# Patient Record
Sex: Female | Born: 1990 | Race: White | Hispanic: No | Marital: Single | Smoking: Current every day smoker
Health system: Southern US, Community
[De-identification: ages and names within clinical notes are randomized; demographics above are authoritative.]

## PROBLEM LIST (undated history)

## (undated) DIAGNOSIS — R7303 Prediabetes: Secondary | ICD-10-CM

## (undated) DIAGNOSIS — T7840XA Allergy, unspecified, initial encounter: Secondary | ICD-10-CM

## (undated) DIAGNOSIS — I10 Essential (primary) hypertension: Secondary | ICD-10-CM

## (undated) DIAGNOSIS — F445 Conversion disorder with seizures or convulsions: Secondary | ICD-10-CM

## (undated) HISTORY — DX: Essential (primary) hypertension: I10

## (undated) HISTORY — DX: Morbid (severe) obesity due to excess calories: E66.01

## (undated) HISTORY — DX: Prediabetes: R73.03

## (undated) HISTORY — DX: Conversion disorder with seizures or convulsions: F44.5

## (undated) HISTORY — DX: Allergy, unspecified, initial encounter: T78.40XA

---

## 2011-07-01 ENCOUNTER — Emergency Department (HOSPITAL_BASED_OUTPATIENT_CLINIC_OR_DEPARTMENT_OTHER)
Admission: EM | Admit: 2011-07-01 | Discharge: 2011-07-02 | Disposition: A | Discharge status: Home / Self Care | Payer: Managed Care, Other (non HMO) | Attending: Emergency Medicine | Admitting: Emergency Medicine

## 2011-07-01 ENCOUNTER — Encounter (HOSPITAL_BASED_OUTPATIENT_CLINIC_OR_DEPARTMENT_OTHER): Payer: Self-pay | Admitting: *Deleted

## 2011-07-01 ENCOUNTER — Emergency Department (INDEPENDENT_AMBULATORY_CARE_PROVIDER_SITE_OTHER): Payer: Managed Care, Other (non HMO)

## 2011-07-01 DIAGNOSIS — M79609 Pain in unspecified limb: Secondary | ICD-10-CM | POA: Insufficient documentation

## 2011-07-01 DIAGNOSIS — R109 Unspecified abdominal pain: Secondary | ICD-10-CM

## 2011-07-01 DIAGNOSIS — M25519 Pain in unspecified shoulder: Secondary | ICD-10-CM | POA: Insufficient documentation

## 2011-07-01 LAB — URINALYSIS, ROUTINE W REFLEX MICROSCOPIC
Ketones, ur: NEGATIVE mg/dL
Leukocytes, UA: NEGATIVE
Nitrite: NEGATIVE
Specific Gravity, Urine: 1.031 — ABNORMAL HIGH (ref 1.005–1.030)
Urobilinogen, UA: 0.2 mg/dL (ref 0.0–1.0)
pH: 5.5 (ref 5.0–8.0)

## 2011-07-01 NOTE — ED Notes (Signed)
C/o right arm pain- denies known injury- took ibuprofen at 2130

## 2011-07-02 DIAGNOSIS — M79609 Pain in unspecified limb: Secondary | ICD-10-CM

## 2011-07-02 NOTE — ED Provider Notes (Signed)
History     CSN: [id number]  Arrival date & time 07/01/11  2248   First MD Initiated Contact with Patient 07/01/11 2308      Chief Complaint  Patient presents with  . Arm Pain    (Consider location/radiation/quality/duration/timing/severity/associated sxs/prior treatment) HPI Patient presents with complaint of pain in her right anterior shoulder which began while she was working at [employer] earlier [employer]. She states the pain was worse with movement. She did do some heavy lifting of boxes today while at work. She also complains of a sharp pain in her right flank. She denies any dysuria, urinary frequency or urgency and no hematuria. She's had no fevers or chills and no vomiting. She denies any specific injury or trauma to her shoulder or torso. She states that she took ibuprofen for her discomfort and now her pain has completely resolved. She states that she has had milder episodes of right shoulder pain in the past but tonight was worse than usual. Movement and palpation made the shoulder pain worse. There no other associated systemic symptoms.  She specifically denies chest pain or shortness of breath  History reviewed. No pertinent past medical history.  History reviewed. No pertinent past surgical history.  No family history on file.  History  Substance Use Topics  . Smoking status: Never Smoker   . Smokeless tobacco: Never Used  . Alcohol Use: No    OB History    Grav Para Term Preterm Abortions TAB SAB Ect Mult Living                  Review of Systems ROS reviewed and otherwise negative except for mentioned in HPI  Allergies  Review of patient's allergies indicates no known allergies.  Home Medications   Current Outpatient Rx  Name Route Sig Dispense Refill  . IBUPROFEN 200 MG PO TABS Oral Take 400 mg by mouth every 6 (six) hours as needed. For pain    . ADULT MULTIVITAMIN W/MINERALS CH Oral Take 1 tablet by mouth daily.    Marland Kitchen OVER THE COUNTER MEDICATION Oral Take  1 tablet by mouth 2 (two) times daily. Raspberry Ketone capsules    . SERTRALINE HCL 100 MG PO TABS Oral Take 100 mg by mouth daily.      BP 149/92  Pulse 81  Temp(Src) 98.9 F (37.2 C) (Oral)  Resp 20  Ht 5\' 11"  (1.803 m)  Wt 300 lb (136.079 kg)  BMI 41.84 kg/m2  SpO2 100%  LMP 06/28/2011 Vitals reviewed Physical Exam Physical Examination: General appearance - alert, well appearing, and in no distress Eyes - pupils equal and reactive, no conjunctival injection Mouth - mucous membranes moist, pharynx normal without lesions Chest - clear to auscultation, no wheezes, rales or rhonchi, symmetric air entry Heart - normal rate, regular rhythm, normal S1, S2, no murmurs, rubs, clicks or gallops Abdomen - soft, nontender, nondistended, no masses or organomegaly Back exam - full range of motion, no tenderness, no CVA tenderness Musculoskeletal - mild right anterior ttp over shoulder, no deformity or swelling, mild pain with ROM of right shoulder Extremities - peripheral pulses normal, no pedal edema, no clubbing or cyanosis Skin - normal coloration and turgor, no rashes  ED Course  Procedures (including critical care time)  Labs Reviewed  URINALYSIS, ROUTINE W REFLEX MICROSCOPIC - Abnormal; Notable for the following:    Specific Gravity, Urine 1.031 (*)    All other components within normal limits  PREGNANCY, URINE   Dg Shoulder Right  07/02/2011  *RADIOLOGY REPORT*  Clinical Data: Pain without trauma.  RIGHT SHOULDER - 2+ VIEW  Comparison: None.  Findings: Negative for fracture, dislocation, or other acute abnormality. Normal alignment and mineralization. No significant degenerative change.  Regional soft tissues unremarkable.  IMPRESSION:  Negative  Original Report Authenticated By: Thora Lance III, M.D.     1. Shoulder pain   2. Flank pain       MDM  Patient presenting with right-sided shoulder pain as well as right flank pain. Shoulder x-ray was reassuring and  shoulder discomfort seems to be musculoskeletal in nature. Patient currently has no flank pain and has normal examination. Her urinalysis was negative for signs of infection or hematuria. Advised patient to take ibuprofen as needed for her discomfort and if pain recurs she should followup with her primary Dr. She was given strict return precautions and is agreeable with this plan.        Ethelda Chick, MD 07/02/11 [phone]

## 2012-09-11 ENCOUNTER — Other Ambulatory Visit: Payer: Self-pay | Admitting: Internal Medicine

## 2012-09-17 ENCOUNTER — Ambulatory Visit
Admission: RE | Admit: 2012-09-17 | Discharge: 2012-09-17 | Disposition: A | Discharge status: Home / Self Care | Payer: Managed Care, Other (non HMO) | Source: Ambulatory Visit | Attending: Internal Medicine | Admitting: Internal Medicine

## 2012-09-17 MED ORDER — IOHEXOL 300 MG/ML  SOLN
125.0000 mL | Freq: Once | INTRAMUSCULAR | Status: AC | PRN
  Administered 2012-09-17: 125 mL via INTRAVENOUS

## 2013-04-07 ENCOUNTER — Encounter: Payer: Self-pay | Admitting: Internal Medicine

## 2013-04-09 ENCOUNTER — Ambulatory Visit: Payer: Self-pay | Admitting: Physician Assistant

## 2013-04-09 ENCOUNTER — Encounter: Payer: Self-pay | Admitting: Physician Assistant

## 2013-04-09 VITALS — BP 118/70 | HR 88 | Temp 97.0°F | Resp 16 | Ht 71.0 in | Wt 345.0 lb

## 2013-04-09 DIAGNOSIS — R7303 Prediabetes: Secondary | ICD-10-CM

## 2013-04-09 DIAGNOSIS — T7840XD Allergy, unspecified, subsequent encounter: Secondary | ICD-10-CM

## 2013-04-09 DIAGNOSIS — I10 Essential (primary) hypertension: Secondary | ICD-10-CM

## 2013-04-09 MED ORDER — PHENTERMINE HCL 37.5 MG PO CAPS: 37.5000 mg | ORAL_CAPSULE | Freq: Every day | ORAL | Status: DC

## 2013-04-09 NOTE — Progress Notes (Signed)
HPI Patient presents for a one month follow up morbid obesity with comorbid conditions of HTN, prediabetes. She started on phenteramine 37.5 one pill 2 weeks ago and today she has lost 9 lbs and denies palpitations, irregular heart beats, stomach pain, muscle weakness, anxiety, insomnia.   Allergies:  No Known Allergies  Current Medications:     Medication List       This list is accurate as of: 04/09/13 11:05 AM.  Always use your most recent med list.               cholecalciferol 1000 UNITS tablet  Commonly known as:  VITAMIN D  Take 9,000 Units by mouth daily.     ibuprofen 200 MG tablet  Commonly known as:  ADVIL,MOTRIN  Take 400 mg by mouth every 6 (six) hours as needed. For pain     lisinopril 20 MG tablet  Commonly known as:  PRINIVIL,ZESTRIL  Take 20 mg by mouth daily.     Magnesium 500 MG Caps  Take by mouth.     metFORMIN 500 MG 24 hr tablet  Commonly known as:  GLUCOPHAGE-XR  Take 500 mg by mouth 2 (two) times daily with a meal.     phentermine 37.5 MG capsule  Take 37.5 mg by mouth daily.     sertraline 100 MG tablet  Commonly known as:  ZOLOFT  Take 100 mg by mouth daily. Take 1.5 pills daily        ROS: all negative expect above.   Physical: Filed Weights   04/09/13 1054  Weight: 345 lb (156.491 kg)   (354 on Oct 16,2014) Filed Vitals:   04/09/13 1054  BP: 118/70  Pulse: 88  Temp: 97 F (36.1 C)  Resp: 16   General Appearance: Well nourished, in no apparent distress. Eyes: PERRLA, EOMs. Sinuses: No Frontal/maxillary tenderness ENT/Mouth: Ext aud canals clear, normal light reflex with TMs without erythema, bulging. Post pharynx without erythema, swelling, exudate.  Respiratory: CTAB Cardio: RRR, no murmurs, rubs or gallops. Peripheral pulses brisk and equal bilaterally, without edema. No aortic or femoral bruits. Abdomen: Obese, soft, with bowl sounds. Nontender, no guarding, rebound. Lymphatics: Non tender without lymphadenopathy.   Musculoskeletal: Full ROM all peripheral extremities, 5/5 strength, and normal gait. Skin: Warm, dry without rashes, lesions, ecchymosis.  Neuro: Cranial nerves intact, reflexes equal bilaterally. Normal muscle tone, no cerebellar symptoms. Sensation intact.  Pysch: Awake and oriented X 3, normal affect, Insight and Judgment appropriate.   Assessment and Plan:  Morbid obesity with HTN, PreDM- Patient is not having any side effects and has lost 9 lbs in 2 weeks.  She has increased her water, starting to eat breakfast, and not doing sodas/sweet tea.  We will continue the phenteramine 37.5mg  with 2 refills and see her back in 2 months.

## 2013-04-09 NOTE — Patient Instructions (Signed)
Calorie Counting Diet A calorie counting diet requires you to eat the number of calories that are right for you in a day. Calories are the measurement of how much energy you get from the food you eat. Eating the right amount of calories is important for staying at a healthy weight. If you eat too many calories, your body will store them as fat and you may gain weight. If you eat too few calories, you may lose weight. Counting the number of calories you eat during a day will help you know if you are eating the right amount. A Registered Dietitian can determine how many calories you need in a day. The amount of calories needed varies from person to person. If your goal is to lose weight, you will need to eat fewer calories. Losing weight can benefit you if you are overweight or have health problems such as heart disease, high blood pressure, or diabetes. If your goal is to gain weight, you will need to eat more calories. Gaining weight may be necessary if you have a certain health problem that causes your body to need more energy. TIPS Whether you are increasing or decreasing the number of calories you eat during a day, it may be hard to get used to changes in what you eat and drink. The following are tips to help you keep track of the number of calories you eat.  Measure foods at home with measuring cups. This helps you know the amount of food and number of calories you are eating.  Restaurants often serve food in amounts that are larger than 1 serving. While eating out, estimate how many servings of a food you are given. For example, a serving of cooked rice is  cup or about the size of half of a fist. Knowing serving sizes will help you be aware of how much food you are eating at restaurants.  Ask for smaller portion sizes or child-size portions at restaurants.  Plan to eat half of a meal at a restaurant. Take the rest home or share the other half with a friend.  Read the Nutrition Facts panel on  food labels for calorie content and serving size. You can find out how many servings are in a package, the size of a serving, and the number of calories each serving has.  For example, a package might contain 3 cookies. The Nutrition Facts panel on that package says that 1 serving is 1 cookie. Below that, it will say there are 3 servings in the container. The calories section of the Nutrition Facts label says there are 90 calories. This means there are 90 calories in 1 cookie (1 serving). If you eat 1 cookie you have eaten 90 calories. If you eat all 3 cookies, you have eaten 270 calories (3 servings x 90 calories = 270 calories). The list below tells you how big or small some common portion sizes are.  1 oz.........4 stacked dice.  3 oz.........Deck of cards.  1 tsp........Tip of little finger.  1 tbs........Thumb.  2 tbs........Golf ball.   cup.......Half of a fist.  1 cup........A fist. KEEP A FOOD LOG Write down every food item you eat, the amount you eat, and the number of calories in each food you eat during the day. At the end of the day, you can add up the total number of calories you have eaten. It may help to keep a list like the one below. Find out the calorie information by reading the   Nutrition Facts panel on food labels. Breakfast  Bran cereal (1 cup, 110 calories).  Fat-free milk ( cup, 45 calories). Snack  Apple (1 medium, 80 calories). Lunch  Spinach (1 cup, 20 calories).  Tomato ( medium, 20 calories).  Chicken breast strips (3 oz, 165 calories).  Shredded cheddar cheese ( cup, 110 calories).  Light Italian dressing (2 tbs, 60 calories).  Whole-wheat bread (1 slice, 80 calories).  Tub margarine (1 tsp, 35 calories).  Vegetable soup (1 cup, 160 calories). Dinner  Pork chop (3 oz, 190 calories).  Brown rice (1 cup, 215 calories).  Steamed broccoli ( cup, 20 calories).  Strawberries (1  cup, 65 calories).  Whipped cream (1 tbs, 50  calories). Daily Calorie Total: 1425 Document Released: 05/14/2005 Document Revised: 08/06/2011 Document Reviewed: 11/08/2006 [employer] Patient Information 2014 [employer].  

## 2013-06-30 ENCOUNTER — Encounter: Payer: Self-pay | Admitting: Emergency Medicine

## 2013-06-30 ENCOUNTER — Ambulatory Visit (INDEPENDENT_AMBULATORY_CARE_PROVIDER_SITE_OTHER): Payer: BC Managed Care – PPO | Admitting: Emergency Medicine

## 2013-06-30 VITALS — BP 124/96 | HR 88 | Temp 98.2°F | Resp 18 | Ht 71.0 in | Wt 336.0 lb

## 2013-06-30 DIAGNOSIS — R7309 Other abnormal glucose: Secondary | ICD-10-CM

## 2013-06-30 DIAGNOSIS — Z79899 Other long term (current) drug therapy: Secondary | ICD-10-CM

## 2013-06-30 DIAGNOSIS — R5383 Other fatigue: Secondary | ICD-10-CM

## 2013-06-30 DIAGNOSIS — E559 Vitamin D deficiency, unspecified: Secondary | ICD-10-CM

## 2013-06-30 DIAGNOSIS — I10 Essential (primary) hypertension: Secondary | ICD-10-CM

## 2013-06-30 DIAGNOSIS — E669 Obesity, unspecified: Secondary | ICD-10-CM

## 2013-06-30 DIAGNOSIS — R5381 Other malaise: Secondary | ICD-10-CM

## 2013-06-30 DIAGNOSIS — J029 Acute pharyngitis, unspecified: Secondary | ICD-10-CM

## 2013-06-30 LAB — HEPATIC FUNCTION PANEL
ALK PHOS: 91 U/L (ref 39–117)
ALT: 15 U/L (ref 0–35)
AST: 16 U/L (ref 0–37)
Albumin: 4.2 g/dL (ref 3.5–5.2)
BILIRUBIN INDIRECT: 0.4 mg/dL (ref 0.2–1.2)
BILIRUBIN TOTAL: 0.6 mg/dL (ref 0.2–1.2)
Bilirubin, Direct: 0.2 mg/dL (ref 0.0–0.3)
TOTAL PROTEIN: 7.5 g/dL (ref 6.0–8.3)

## 2013-06-30 LAB — BASIC METABOLIC PANEL WITH GFR
BUN: 12 mg/dL (ref 6–23)
CHLORIDE: 106 meq/L (ref 96–112)
CO2: 24 meq/L (ref 19–32)
CREATININE: 0.66 mg/dL (ref 0.50–1.10)
Calcium: 9.5 mg/dL (ref 8.4–10.5)
GFR, Est African American: >89 mL/min
GFR, Est Non African American: >89 mL/min
Glucose, Bld: 79 mg/dL (ref 70–99)
Potassium: 4.6 mEq/L (ref 3.5–5.3)
Sodium: 138 mEq/L (ref 135–145)

## 2013-06-30 LAB — VITAMIN B12: VITAMIN B 12: 574 pg/mL (ref 211–911)

## 2013-06-30 LAB — MAGNESIUM: Magnesium: 1.7 mg/dL (ref 1.5–2.5)

## 2013-06-30 LAB — HEMOGLOBIN A1C
Hgb A1c MFr Bld: 5.5 % (ref <5.7)
Mean Plasma Glucose: 111 mg/dL (ref <117)

## 2013-06-30 MED ORDER — AZITHROMYCIN 250 MG PO TABS: ORAL_TABLET | ORAL | Status: AC

## 2013-06-30 MED ORDER — PHENTERMINE HCL 37.5 MG PO CAPS: 37.5000 mg | ORAL_CAPSULE | Freq: Every day | ORAL | Status: DC

## 2013-06-30 MED ORDER — PREDNISONE 10 MG PO TABS: ORAL_TABLET | ORAL | Status: DC

## 2013-06-30 NOTE — Patient Instructions (Addendum)
Exercise to Lose Weight Exercise and a healthy diet may help you lose weight. Your doctor may suggest specific exercises. EXERCISE IDEAS AND TIPS  Choose low-cost things you enjoy doing, such as walking, bicycling, or exercising to workout videos.  Take stairs instead of the elevator.  Walk during your lunch break.  Park your car further away from work or school.  Go to a gym or an exercise class.  Start with 5 to 10 minutes of exercise each day. Build up to 30 minutes of exercise 4 to 6 days a week.  Wear shoes with good support and comfortable clothes.  Stretch before and after working out.  Work out until you breathe harder and your heart beats faster.  Drink extra water when you exercise.  Do not do so much that you hurt yourself, feel dizzy, or get very short of breath. Exercises that burn about 150 calories:  Running 1  miles in 15 minutes.  Playing volleyball for 45 to 60 minutes.  Washing and waxing a car for 45 to 60 minutes.  Playing touch football for 45 minutes.  Walking 1  miles in 35 minutes.  Pushing a stroller 1  miles in 30 minutes.  Playing basketball for 30 minutes.  Raking leaves for 30 minutes.  Bicycling 5 miles in 30 minutes.  Walking 2 miles in 30 minutes.  Dancing for 30 minutes.  Shoveling snow for 15 minutes.  Swimming laps for 20 minutes.  Walking up stairs for 15 minutes.  Bicycling 4 miles in 15 minutes.  Gardening for 30 to 45 minutes.  Jumping rope for 15 minutes.  Washing windows or floors for 45 to 60 minutes. Document Released: 06/16/2010 Document Revised: 08/06/2011 Document Reviewed: 06/16/2010 [facility] Patient Information 2014 [place], [place]. Sore Throat Warm salt water gargles daily. 1 tsp liquid benadryl + 1 tsp liquid Maalox, MIX/ GARGLE/ SPIT as needed for pain Add allegra in the daytime for allergy. Tylenol OTC for pain. A sore throat is a painful, burning, sore, or scratchy feeling of the throat. There  may be pain or tenderness when swallowing or talking. You may have other symptoms with a sore throat. These include coughing, sneezing, fever, or a swollen neck. A sore throat is often the first sign of another sickness. These sicknesses may include a cold, flu, strep throat, or an infection called mono. Most sore throats go away without medical treatment.  HOME CARE   Only take medicine as told by your doctor.  Drink enough fluids to keep your pee (urine) clear or pale yellow.  Rest as needed.  Try using throat sprays, lozenges, or suck on hard candy (if older than 4 years or as told).  Sip warm liquids, such as broth, herbal tea, or warm water with honey. Try sucking on frozen ice pops or drinking cold liquids.  Rinse the mouth (gargle) with salt water. Mix 1 teaspoon salt with 8 ounces of water.  Do not smoke. Avoid being around others when they are smoking.  Put a humidifier in your bedroom at night to moisten the air. You can also turn on a hot shower and sit in the bathroom for 5 10 minutes. Be sure the bathroom door is closed. GET HELP RIGHT AWAY IF:   You have trouble breathing.  You cannot swallow fluids, soft foods, or your spit (saliva).  You have more puffiness (swelling) in the throat.  Your sore throat does not get better in 7 days.  You feel sick to your stomach (  nauseous) and throw up (vomit).  You have a fever or lasting symptoms for more than 2 3 days.  You have a fever and your symptoms suddenly get worse. MAKE SURE YOU:   Understand these instructions.  Will watch your condition.  Will get help right away if you are not doing well or get worse. Document Released: 02/21/2008 Document Revised: 02/06/2012 Document Reviewed: 01/20/2012 [facility] Patient Information 2014 [place], [place].

## 2013-06-30 NOTE — Progress Notes (Signed)
Subjective:    Patient ID: Christy Foster, female    DOB: 1990-08-10, 23 y.o.   MRN: [id number]  HPI Comments: 23 yo female with allergy drainage x >1 week with out any relief with benadryl/ Dayquil. She notes her throat has now started hurting and drainage continues to come out but clear.  She started Phentermine 03/2013 she is down 23 # total, down 9 # since last visit. She is eating healthier/ smaller portions. She is not exercising.  LAST LABS 5.9 D 54  Sore Throat  Associated symptoms include congestion.     Medication List       This list is accurate as of: 06/30/13 10:09 AM.  Always use your most recent med list.               cholecalciferol 1000 UNITS tablet  Commonly known as:  VITAMIN D  Take 9,000 Units by mouth daily.     ibuprofen 200 MG tablet  Commonly known as:  ADVIL,MOTRIN  Take 400 mg by mouth every 6 (six) hours as needed. For pain     lisinopril 20 MG tablet  Commonly known as:  PRINIVIL,ZESTRIL  Take 20 mg by mouth daily.     Magnesium 500 MG Caps  Take by mouth.     metFORMIN 500 MG 24 hr tablet  Commonly known as:  GLUCOPHAGE-XR  Take 500 mg by mouth 2 (two) times daily with a meal.     phentermine 37.5 MG capsule  Take 1 capsule (37.5 mg total) by mouth daily.     sertraline 100 MG tablet  Commonly known as:  ZOLOFT  Take 100 mg by mouth daily. Take 1.5 pills daily       ALLERGIES Review of patient's allergies indicates no known allergies.  Past Medical History  Diagnosis Date  . Hypertension   . Allergy   . Pseudoseizure   . Prediabetes   . Morbid obesity       Review of Systems  Constitutional: Positive for fatigue.  HENT: Positive for congestion and sore throat.   All other systems reviewed and are negative.   BP 124/96  Pulse 88  Temp(Src) 98.2 F (36.8 C) (Temporal)  Resp 18  Ht 5\' 11"  (1.803 m)  Wt 336 lb (152.409 kg)  BMI 46.88 kg/m2  LMP 06/08/2013     Objective:   Physical Exam  Nursing note and  vitals reviewed. Constitutional: She is oriented to person, place, and time. She appears well-developed and well-nourished. No distress.  obese  HENT:  Head: Normocephalic and atraumatic.  Right Ear: External ear normal.  Left Ear: External ear normal.  Nose: Nose normal.  Mouth/Throat: Oropharynx is clear and moist. No oropharyngeal exudate.  2 + erythematous tonsils with yellow exudate  Eyes: Conjunctivae and EOM are normal.  Neck: Normal range of motion. Neck supple. No JVD present. No thyromegaly present.  Cardiovascular: Normal rate, regular rhythm, normal heart sounds and intact distal pulses.   Pulmonary/Chest: Effort normal and breath sounds normal.  Abdominal: Soft. Bowel sounds are normal. She exhibits no distension and no mass. There is no tenderness. There is no rebound and no guarding.  Musculoskeletal: Normal range of motion. She exhibits no edema and no tenderness.  Lymphadenopathy:    She has cervical adenopathy.  Neurological: She is alert and oriented to person, place, and time. No cranial nerve deficit.  Skin: Skin is warm and dry. No rash noted. No erythema. No pallor.  Psychiatric: She has a  normal mood and affect. Her behavior is normal. Judgment and thought content normal.      EKG WNL    Assessment & Plan:  1.  3 month F/U for HTN, Obese, Pre-Dm, D. Deficient. Needs healthy diet, cardio QD and obtain healthy weight. Check Labs, Check BP if >130/80 call office. Refill Phentermine 37.5 try to start decreasing to 1/2 tab, use AD, Check EKG with Wt loss Rx 2. Fatigue- check labs, increase activity and H2O 3. Prob strept- Pred Dp 10mg  AD, Zpak AD. Allergic rhinitis- Allegra OTC, increase H2o, allergy hygiene explained. OOw x 2 days Warm salt water gargles daily. 1 tsp liquid benadryl + 1 tsp liquid Maalox, MIX/ GARGLE/ SPIT as needed for pain

## 2013-07-01 LAB — CBC WITH DIFFERENTIAL/PLATELET
Basophils Absolute: 0.1 10*3/uL (ref 0.0–0.1)
Basophils Relative: 0 % (ref 0–1)
EOS PCT: 1 % (ref 0–5)
Eosinophils Absolute: 0.2 10*3/uL (ref 0.0–0.7)
HEMATOCRIT: 39.5 % (ref 36.0–46.0)
HEMOGLOBIN: 13.4 g/dL (ref 12.0–15.0)
LYMPHS ABS: 3.2 10*3/uL (ref 0.7–4.0)
LYMPHS PCT: 19 % (ref 12–46)
MCH: 29.4 pg (ref 26.0–34.0)
MCHC: 33.9 g/dL (ref 30.0–36.0)
MCV: 86.6 fL (ref 78.0–100.0)
MONO ABS: 1.5 10*3/uL — AB (ref 0.1–1.0)
Monocytes Relative: 9 % (ref 3–12)
Neutro Abs: 11.6 10*3/uL — ABNORMAL HIGH (ref 1.7–7.7)
Neutrophils Relative %: 71 % (ref 43–77)
Platelets: 334 10*3/uL (ref 150–400)
RBC: 4.56 MIL/uL (ref 3.87–5.11)
RDW: 13.2 % (ref 11.5–15.5)
WBC: 16.6 10*3/uL — AB (ref 4.0–10.5)

## 2013-07-01 LAB — VITAMIN D 25 HYDROXY (VIT D DEFICIENCY, FRACTURES): Vit D, 25-Hydroxy: 48 ng/mL (ref 30–89)

## 2013-07-01 LAB — INSULIN, FASTING: Insulin fasting, serum: 33 u[IU]/mL — ABNORMAL HIGH (ref 3–28)

## 2013-07-07 NOTE — Progress Notes (Signed)
LMOM TO CALL & SCHEDULE  

## 2013-08-12 NOTE — Progress Notes (Signed)
#  2   = LMOM TO CALL  

## 2013-08-19 NOTE — Progress Notes (Signed)
#  2 LMOM =  TO CALL & SCHEDULE

## 2013-08-23 IMAGING — CT CT ABDOMEN W/ CM
2 of 4 series · 17 of 46 positions shown, 19 images · IV contrast (READICAT/WATER & [ID] OMNI 300)
Comparison: None.

CLINICAL DATA: Evaluate palpable mass.

CT ABDOMEN WITH CONTRAST
TECHNIQUE: Multidetector CT imaging of the abdomen was performed
following the standard protocol during bolus administration of
intravenous contrast.
Contrast: 125mL OMNIPAQUE IOHEXOL 300 MG/ML  SOLN

[Series 2: abdomen w/ · axial · 0.98mm/px · z∈[-288,+26]mm · 14 of 71 slices shown, 16 images]
[im 4/71  soft-tissue]
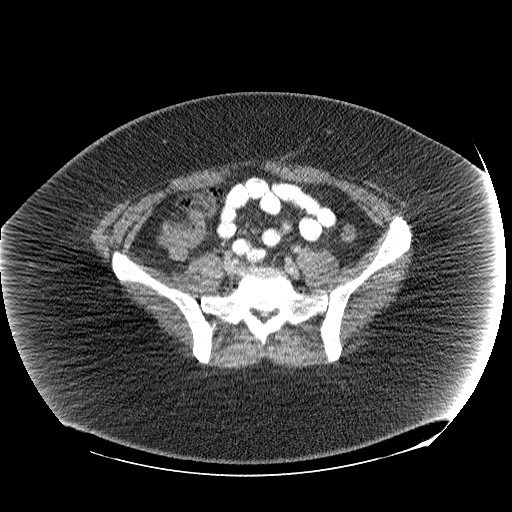
[im 4/71  bone]
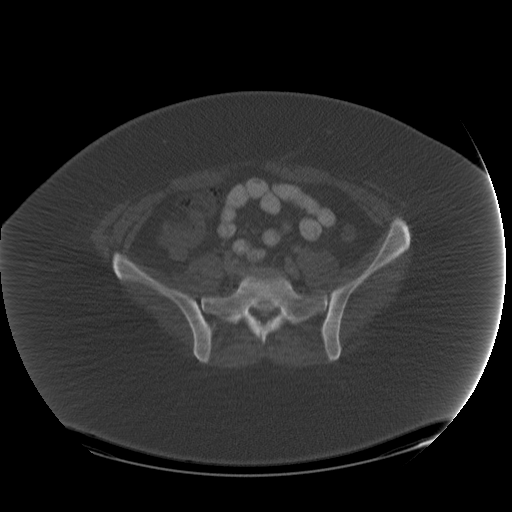
[im 10/71  soft-tissue]
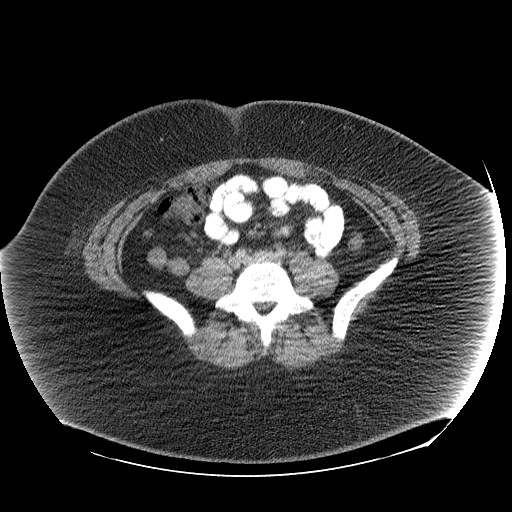
[im 13/71  soft-tissue]
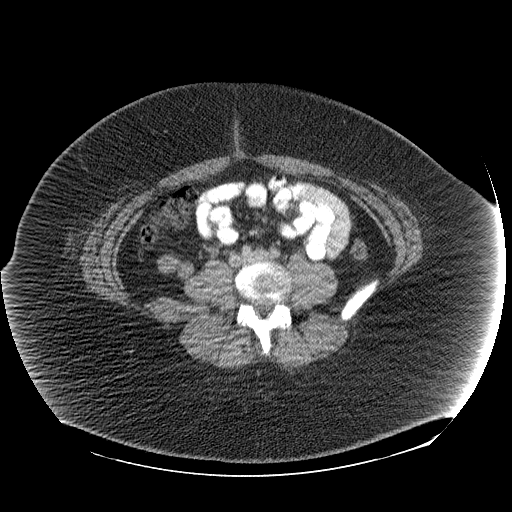
[im 20/71  soft-tissue]
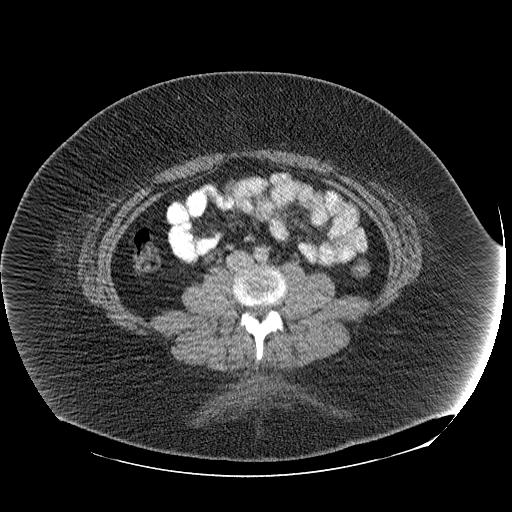
[im 23/71  soft-tissue]
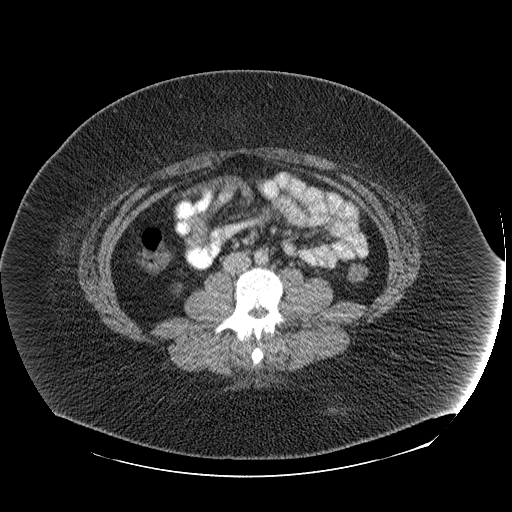
[im 29/71  soft-tissue]
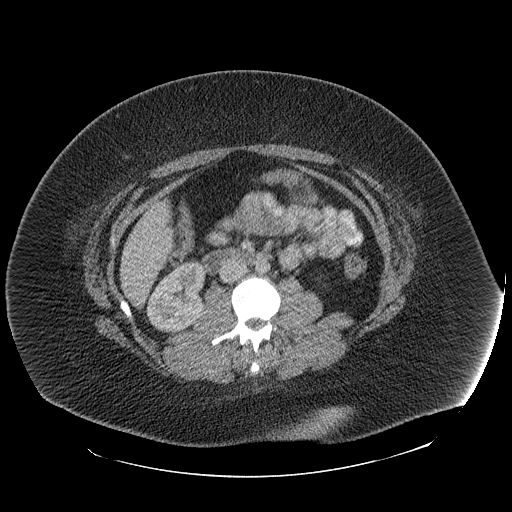
[im 32/71  soft-tissue]
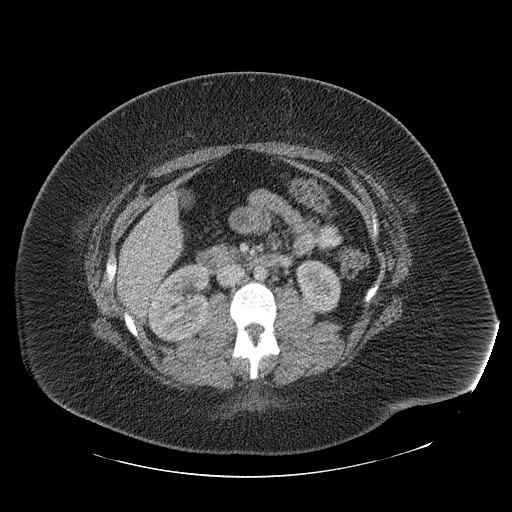
[im 39/71  soft-tissue]
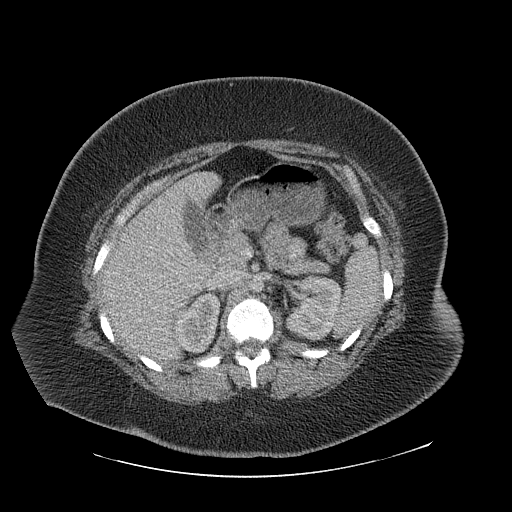
[im 42/71  soft-tissue]
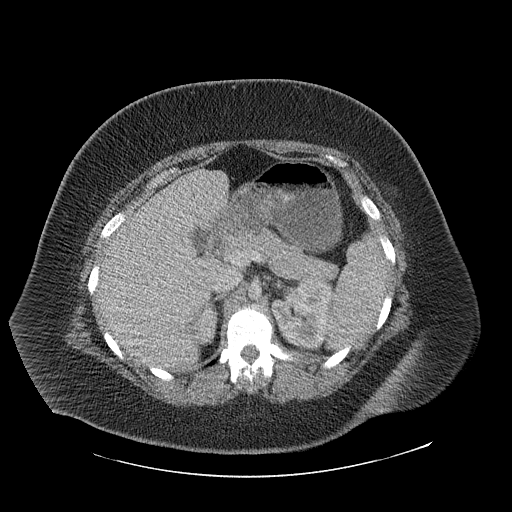
[im 42/71  bone]
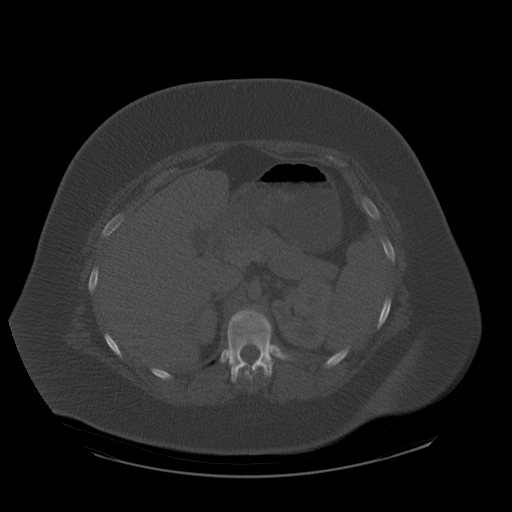
[im 48/71  soft-tissue]
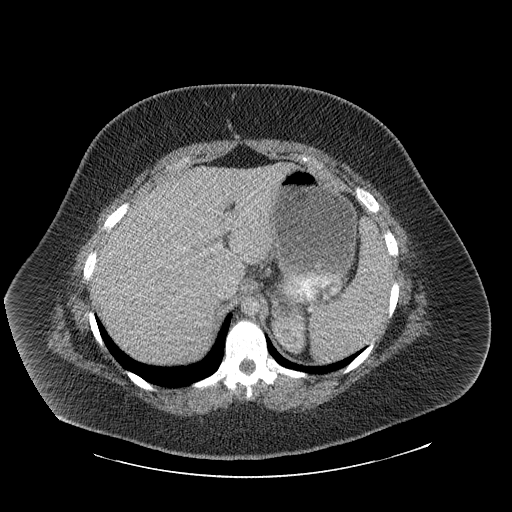
[im 51/71  soft-tissue]
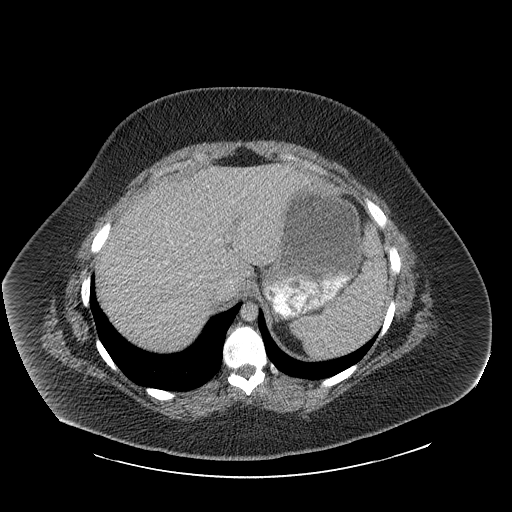
[im 58/71  soft-tissue]
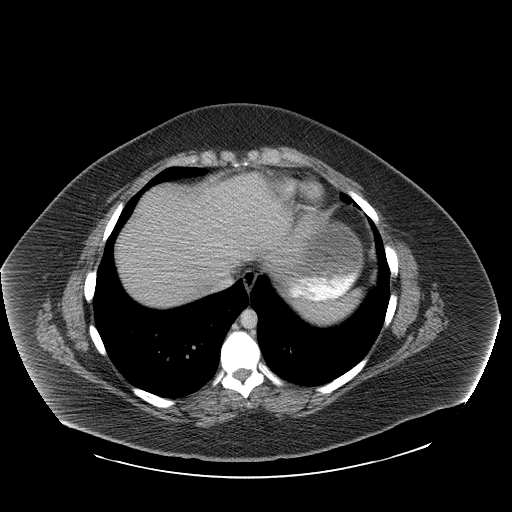
[im 61/71  soft-tissue]
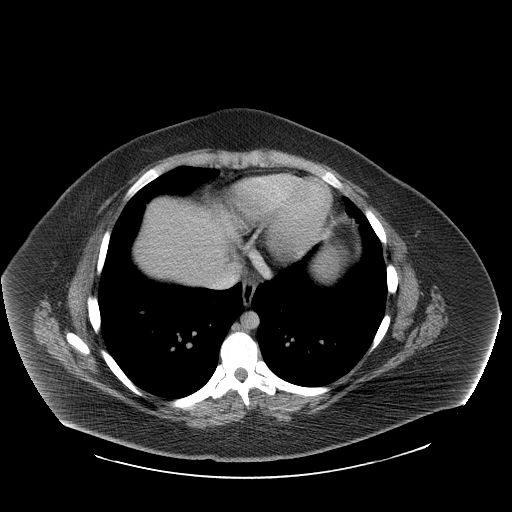
[im 67/71  soft-tissue]
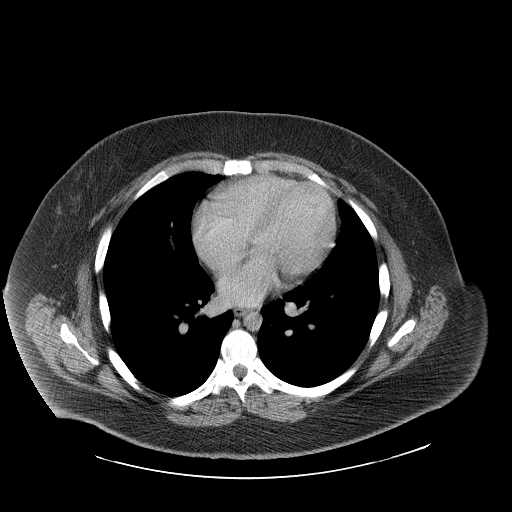

[Series 400: coronal · coronal · 0.98mm/px · 3 of 166 slices shown]
[im 56/166  soft-tissue]
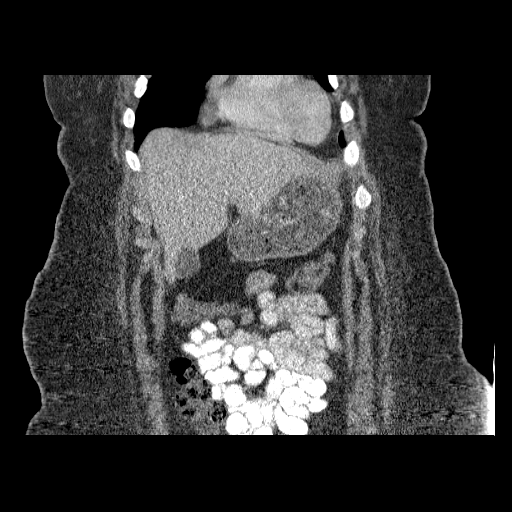
[im 74/166  soft-tissue]
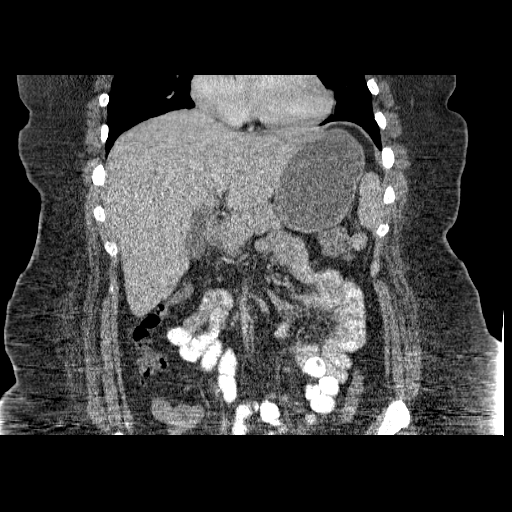
[im 92/166  soft-tissue]
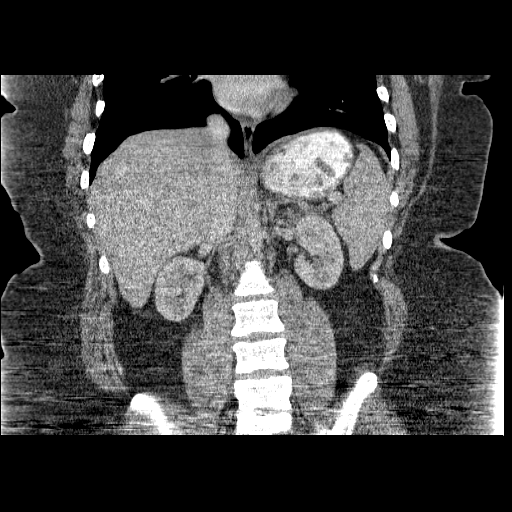

[17 of 46 positions shown; findings below may reference images not displayed]

FINDINGS: Lung bases show no acute findings.  Heart size within
normal limits.  No pericardial or pleural effusion.

Image quality is somewhat degraded by body habitus.  Liver,
gallbladder, adrenal glands, kidneys, spleen, pancreas, stomach and
visualized bowel are unremarkable.  No pathologically enlarged
lymph nodes.  No free fluid.

A cutaneous marker was placed over the ventral and medial aspect of
the right upper quadrant (image 28).  There is no associated soft
tissue abnormality.  No worrisome lytic or sclerotic lesions.
IMPRESSION: No findings to explain the palpable abnormality along the ventral
abdomen, reported by the patient.

## 2013-08-24 NOTE — Progress Notes (Signed)
No response from pt called several times to schedule

## 2013-09-04 ENCOUNTER — Ambulatory Visit (INDEPENDENT_AMBULATORY_CARE_PROVIDER_SITE_OTHER): Payer: BC Managed Care – PPO | Admitting: *Deleted

## 2013-09-04 MED ORDER — PHENTERMINE HCL 37.5 MG PO CAPS: 37.5000 mg | ORAL_CAPSULE | ORAL | Status: DC

## 2013-09-04 NOTE — Progress Notes (Signed)
Patient ID: [name], female   DOB: 29-Sep-1990, 23 y.o.   MRN: [id number] Patient here for weight and BP check for refill  Of Phentermine 37.5 mg.  Patient's weight down 7.8 lb. RX refilled by Dr Oneta RackMcKeown.

## 2013-10-01 ENCOUNTER — Other Ambulatory Visit: Payer: Self-pay | Admitting: Emergency Medicine

## 2013-10-05 ENCOUNTER — Ambulatory Visit (INDEPENDENT_AMBULATORY_CARE_PROVIDER_SITE_OTHER): Payer: BC Managed Care – PPO | Admitting: *Deleted

## 2013-10-05 VITALS — BP 136/82

## 2013-10-05 DIAGNOSIS — R6889 Other general symptoms and signs: Secondary | ICD-10-CM

## 2013-10-05 LAB — CBC WITH DIFFERENTIAL/PLATELET
BASOS ABS: 0 10*3/uL (ref 0.0–0.1)
Basophils Relative: 0 % (ref 0–1)
Eosinophils Absolute: 0.3 10*3/uL (ref 0.0–0.7)
Eosinophils Relative: 2 % (ref 0–5)
HEMATOCRIT: 37.2 % (ref 36.0–46.0)
HEMOGLOBIN: 12.6 g/dL (ref 12.0–15.0)
LYMPHS PCT: 28 % (ref 12–46)
Lymphs Abs: 3.9 10*3/uL (ref 0.7–4.0)
MCH: 28.8 pg (ref 26.0–34.0)
MCHC: 33.9 g/dL (ref 30.0–36.0)
MCV: 85.1 fL (ref 78.0–100.0)
MONO ABS: 1 10*3/uL (ref 0.1–1.0)
Monocytes Relative: 7 % (ref 3–12)
NEUTROS ABS: 8.8 10*3/uL — AB (ref 1.7–7.7)
Neutrophils Relative %: 63 % (ref 43–77)
Platelets: 359 10*3/uL (ref 150–400)
RBC: 4.37 MIL/uL (ref 3.87–5.11)
RDW: 13.2 % (ref 11.5–15.5)
WBC: 14 10*3/uL — AB (ref 4.0–10.5)

## 2013-10-05 MED ORDER — PHENTERMINE HCL 37.5 MG PO CAPS: 37.5000 mg | ORAL_CAPSULE | ORAL | Status: DC

## 2013-10-05 NOTE — Progress Notes (Signed)
Patient ID: [name], female   DOB: 1991-03-01, 23 y.o.   MRN: [id number] Patient presents for NV for recheck weight, BP and recheck CBC.  Patient has lost another 14 lbs from last month and per Quentin MullingAmanda Collier, PA-C ok to continue Phentermine Rx.

## 2013-10-27 ENCOUNTER — Other Ambulatory Visit: Payer: Self-pay | Admitting: Physician Assistant

## 2013-11-04 ENCOUNTER — Ambulatory Visit (INDEPENDENT_AMBULATORY_CARE_PROVIDER_SITE_OTHER): Payer: BC Managed Care – PPO | Admitting: Physician Assistant

## 2013-11-04 VITALS — BP 110/70 | HR 100 | Temp 97.5°F | Resp 16 | Ht 71.0 in | Wt 327.0 lb

## 2013-11-04 DIAGNOSIS — Z1159 Encounter for screening for other viral diseases: Secondary | ICD-10-CM

## 2013-11-04 DIAGNOSIS — Z118 Encounter for screening for other infectious and parasitic diseases: Secondary | ICD-10-CM

## 2013-11-04 DIAGNOSIS — Z0000 Encounter for general adult medical examination without abnormal findings: Secondary | ICD-10-CM

## 2013-11-04 DIAGNOSIS — Z113 Encounter for screening for infections with a predominantly sexual mode of transmission: Secondary | ICD-10-CM

## 2013-11-04 DIAGNOSIS — I10 Essential (primary) hypertension: Secondary | ICD-10-CM

## 2013-11-04 LAB — CBC WITH DIFFERENTIAL/PLATELET
Basophils Absolute: 0 10*3/uL (ref 0.0–0.1)
Basophils Relative: 0 % (ref 0–1)
EOS ABS: 0.3 10*3/uL (ref 0.0–0.7)
Eosinophils Relative: 2 % (ref 0–5)
HCT: 40 % (ref 36.0–46.0)
HEMOGLOBIN: 13.8 g/dL (ref 12.0–15.0)
LYMPHS ABS: 4 10*3/uL (ref 0.7–4.0)
LYMPHS PCT: 23 % (ref 12–46)
MCH: 29.1 pg (ref 26.0–34.0)
MCHC: 34.5 g/dL (ref 30.0–36.0)
MCV: 84.2 fL (ref 78.0–100.0)
MONOS PCT: 6 % (ref 3–12)
Monocytes Absolute: 1 10*3/uL (ref 0.1–1.0)
NEUTROS ABS: 11.9 10*3/uL — AB (ref 1.7–7.7)
NEUTROS PCT: 69 % (ref 43–77)
Platelets: 398 10*3/uL (ref 150–400)
RBC: 4.75 MIL/uL (ref 3.87–5.11)
RDW: 13.2 % (ref 11.5–15.5)
WBC: 17.2 10*3/uL — AB (ref 4.0–10.5)

## 2013-11-04 MED ORDER — PHENTERMINE HCL 37.5 MG PO CAPS: 37.5000 mg | ORAL_CAPSULE | Freq: Every day | ORAL | Status: DC

## 2013-11-04 MED ORDER — METFORMIN HCL ER 500 MG PO TB24: 500.0000 mg | ORAL_TABLET | Freq: Two times a day (BID) | ORAL | Status: DC

## 2013-11-04 MED ORDER — LISINOPRIL 20 MG PO TABS
ORAL_TABLET | ORAL | Status: DC
Start: 2013-11-04 — End: 2017-05-27

## 2013-11-04 NOTE — Patient Instructions (Signed)

## 2013-11-04 NOTE — Progress Notes (Signed)
Complete Physical  Assessment and Plan: Hypertension-- continue medications, DASH diet, exercise and monitor at home. Call if greater than 130/80.   Allergic rhinitis- Allegra OTC, increase H20, allergy hygiene explained.  Pseudoseizure- remission/remote  Prediabetes-Discussed general issues about diabetes pathophysiology and management., Educational material distributed., Suggested low cholesterol diet., Encouraged aerobic exercise., Discussed foot care., Reminded to get yearly retinal exam.  Obesity with co morbidities- long discussion about weight loss, diet, and exercise  Will start the patient on phentermine- hand out given  PAP- reschedule, on menses- may try nuvaring then STD screening per patient w/o signs of infection Health Maintenance  Discussed med's effects and SE's. Screening labs and tests as requested with regular follow-up as recommended.  HPI 23 y.o. female  presents for a complete physical. Her blood pressure has been controlled at home, today their BP is BP: 110/70 mmHg She does not workout. She denies chest pain, shortness of breath, dizziness.  She is not on cholesterol medication and denies myalgias. Her cholesterol is at goal. The cholesterol last visit was: LDL 70  Last A1C in the office was: (5.9) Lab Results  Component Value Date   HGBA1C 5.5 06/30/2013   Patient is on Vitamin D supplement.  Mag 1.6 She has been on the phentermine for 6 month and she has lost about 30 lbs.   Current Medications:  Current Outpatient Prescriptions on File Prior to Visit  Medication Sig Dispense Refill  . cholecalciferol (VITAMIN D) 1000 UNITS tablet Take 9,000 Units by mouth daily.      Marland Kitchen. ibuprofen (ADVIL,MOTRIN) 200 MG tablet Take 400 mg by mouth every 6 (six) hours as needed. For pain      . lisinopril (PRINIVIL,ZESTRIL) 20 MG tablet TAKE 1 TABLET BY MOUTH EVERY DAY  90 tablet  1  . Magnesium 500 MG CAPS Take by mouth.      . metFORMIN (GLUCOPHAGE) 500 MG tablet TAKE 1  TABLET BY MOUTH TWICE A DAY  60 tablet  6  . metFORMIN (GLUCOPHAGE-XR) 500 MG 24 hr tablet Take 500 mg by mouth 2 (two) times daily with a meal.      . phentermine 37.5 MG capsule Take 1 capsule (37.5 mg total) by mouth daily.  30 capsule  0  . phentermine 37.5 MG capsule Take 1 capsule (37.5 mg total) by mouth every morning.  30 capsule  0  . predniSONE (DELTASONE) 10 MG tablet 1 po TID x 3 days, 1 PO BID x 3 days, 1 po QD x 5 days  20 tablet  0  . sertraline (ZOLOFT) 100 MG tablet Take 100 mg by mouth daily. Take 1.5 pills daily       No current facility-administered medications on file prior to visit.   Health Maintenance:   Immunization History  Administered Date(s) Administered  . Tdap 03/08/2007   Tetanus: 2008 Pneumovax: N/A Flu vaccine: N/A Zostavax:N/A Gaurdisil vaccine: 3/3 Menses: NOW Pap: Has not had in the past- will reschedule She is sexually active,  Has had 2 partners, current boyfriend for 6 months.  MGM: N/A DEXA: N/A Colonoscopy: N/A EGD: N/A  Allergies: No Known Allergies Medical History:  Past Medical History  Diagnosis Date  . Hypertension   . Allergy   . Pseudoseizure   . Prediabetes   . Morbid obesity    Surgical History: No past surgical history on file. Family History:  Family History  Problem Relation Age of Onset  . Hypertension Mother   . Depression Mother   .  Hypertension Father    Social History:  History  Substance Use Topics  . Smoking status: Never Smoker   . Smokeless tobacco: Never Used  . Alcohol Use: No    Review of Systems: [X]  = complains of  [ ]  = denies  General: Fatigue [ ]  Fever [ ]  Chills [ ]  Weakness [ ]   Insomnia [ ] Weight change [ ]  Night sweats [ ]   Change in appetite [ ]  Eyes: Redness [ ]  Blurred vision [ ]  Diplopia [ ]  Discharge [ ]   ENT: Congestion [ ]  Sinus Pain [ ]  Post Nasal Drip [ ]  Sore Throat [ ]  Earache [ ]  hearing loss [ ]  Tinnitus [ ]  Snoring [ ]   Cardiac: Chest pain/pressure [ ]  SOB [ ]  Orthopnea [  ]  Palpitations [ ]   Paroxysmal nocturnal dyspnea[ ]  Claudication [ ]  Edema [ ]   Pulmonary: Cough [ ]  Wheezing[ ]   SOB [ ]   Pleurisy [ ]   GI: Nausea [ ]  Vomiting[ ]  Dysphagia[ ]  Heartburn[ ]  Abdominal pain [ ]  Constipation [ ] ; Diarrhea [ ]  BRBPR [ ]  Melena[ ]  Bloating [ ]  Hemorrhoids [ ]   GU: Hematuria[ ]  Dysuria [ ]  Nocturia[ ]  Urgency [ ]   Hesitancy [ ]  Discharge [ ]  Frequency [ ]   Breast:  Breast lumps [ ]   nipple discharge [ ]    Neuro: Headaches[ ]  Vertigo[ ]  Paresthesias[ ]  Spasm [ ]  Speech changes [ ]  Incoordination [ ]   Ortho: Arthritis [ ]  Joint pain [ ]  Muscle pain [ ]  Joint swelling [ ]  Back Pain [ ]  Skin:  Rash [ ]   Pruritis [ ]  Change in skin lesion [ ]   Psych: Depression[ ]  Anxiety[ ]  Confusion [ ]  Memory loss [ ]   Heme/Lypmh: Bleeding [ ]  Bruising [ ]  Enlarged lymph nodes [ ]   Endocrine: Visual blurring [ ]  Paresthesia [ ]  Polyuria [ ]  Polydypsea [ ]    Heat/cold intolerance [ ]  Hypoglycemia [ ]   Physical Exam: Estimated body mass index is 45.63 kg/(m^2) as calculated from the following:   Height as of this encounter: 5\' 11"  (1.803 m).   Weight as of this encounter: 327 lb (148.326 kg). BP 110/70  Pulse 100  Temp(Src) 97.5 F (36.4 C)  Resp 16  Ht 5\' 11"  (1.803 m)  Wt 327 lb (148.326 kg)  BMI 45.63 kg/m2 Wt Readings from Last 3 Encounters:  11/04/13 327 lb (148.326 kg)  09/04/13 328 lb 12.8 oz (149.143 kg)  06/30/13 336 lb (152.409 kg)    General Appearance: Well nourished, in no apparent distress. Eyes: PERRLA, EOMs, conjunctiva no swelling or erythema, normal fundi and vessels. Sinuses: No Frontal/maxillary tenderness ENT/Mouth: Ext aud canals clear, normal light reflex with TMs without erythema, bulging.  Good dentition. No erythema, swelling, or exudate on post pharynx. Tonsils not swollen or erythematous. Hearing normal.  Neck: Supple, thyroid normal. No bruits Respiratory: Respiratory effort normal, BS equal bilaterally without rales, rhonchi, wheezing or  stridor. Cardio: RRR without murmurs, rubs or gallops. Brisk peripheral pulses without edema.  Chest: symmetric, with normal excursions and percussion. Breasts: Symmetric, without lumps, nipple discharge, retractions. Abdomen: Soft, +BS, obese. Non tender, no guarding, rebound, hernias, masses, or organomegaly. .  Lymphatics: Non tender without lymphadenopathy.  Genitourinary: defer Musculoskeletal: Full ROM all peripheral extremities,5/5 strength, and normal gait. Skin: Warm, dry without rashes, lesions, ecchymosis.  Neuro: Cranial nerves intact, reflexes equal bilaterally. Normal muscle tone, no cerebellar symptoms. Sensation intact.  Psych: Awake and oriented X 3, normal affect,  Insight and Judgment appropriate.    Quentin Mulling 2:19 PM

## 2013-11-05 LAB — LIPID PANEL
CHOL/HDL RATIO: 2.4 ratio
Cholesterol: 134 mg/dL (ref 0–200)
HDL: 57 mg/dL (ref >39)
LDL CALC: 52 mg/dL (ref 0–99)
Triglycerides: 123 mg/dL (ref <150)
VLDL: 25 mg/dL (ref 0–40)

## 2013-11-05 LAB — URINALYSIS, ROUTINE W REFLEX MICROSCOPIC
BILIRUBIN URINE: NEGATIVE
GLUCOSE, UA: NEGATIVE mg/dL
HGB URINE DIPSTICK: NEGATIVE
Ketones, ur: NEGATIVE mg/dL
LEUKOCYTES UA: NEGATIVE
Nitrite: NEGATIVE
PH: 6.5 (ref 5.0–8.0)
PROTEIN: NEGATIVE mg/dL
Specific Gravity, Urine: 1.024 (ref 1.005–1.030)
Urobilinogen, UA: 0.2 mg/dL (ref 0.0–1.0)

## 2013-11-05 LAB — VITAMIN B12: VITAMIN B 12: 854 pg/mL (ref 211–911)

## 2013-11-05 LAB — BASIC METABOLIC PANEL WITH GFR
BUN: 10 mg/dL (ref 6–23)
CHLORIDE: 102 meq/L (ref 96–112)
CO2: 24 meq/L (ref 19–32)
Calcium: 9.8 mg/dL (ref 8.4–10.5)
Creat: 0.73 mg/dL (ref 0.50–1.10)
GFR, Est African American: >89 mL/min
GFR, Est Non African American: >89 mL/min
GLUCOSE: 99 mg/dL (ref 70–99)
POTASSIUM: 4 meq/L (ref 3.5–5.3)
SODIUM: 138 meq/L (ref 135–145)

## 2013-11-05 LAB — MICROALBUMIN / CREATININE URINE RATIO
Creatinine, Urine: 239.5 mg/dL
MICROALB UR: 0.76 mg/dL (ref 0.00–1.89)
MICROALB/CREAT RATIO: 3.2 mg/g (ref 0.0–30.0)

## 2013-11-05 LAB — HEPATIC FUNCTION PANEL
ALK PHOS: 102 U/L (ref 39–117)
ALT: 16 U/L (ref 0–35)
AST: 18 U/L (ref 0–37)
Albumin: 4.3 g/dL (ref 3.5–5.2)
BILIRUBIN DIRECT: 0.1 mg/dL (ref 0.0–0.3)
BILIRUBIN INDIRECT: 0.3 mg/dL (ref 0.2–1.2)
BILIRUBIN TOTAL: 0.4 mg/dL (ref 0.2–1.2)
Total Protein: 7.7 g/dL (ref 6.0–8.3)

## 2013-11-05 LAB — IRON AND TIBC
%SAT: 16 % — AB (ref 20–55)
Iron: 58 ug/dL (ref 42–145)
TIBC: 374 ug/dL (ref 250–470)
UIBC: 316 ug/dL (ref 125–400)

## 2013-11-05 LAB — TSH: TSH: 1.67 u[IU]/mL (ref 0.350–4.500)

## 2013-11-05 LAB — GC/CHLAMYDIA PROBE AMP, URINE
Chlamydia, Swab/Urine, PCR: NEGATIVE
GC Probe Amp, Urine: NEGATIVE

## 2013-11-05 LAB — HEMOGLOBIN A1C
HEMOGLOBIN A1C: 5.5 % (ref <5.7)
MEAN PLASMA GLUCOSE: 111 mg/dL (ref <117)

## 2013-11-05 LAB — VITAMIN D 25 HYDROXY (VIT D DEFICIENCY, FRACTURES): Vit D, 25-Hydroxy: 48 ng/mL (ref 30–89)

## 2013-11-05 LAB — RPR

## 2013-11-05 LAB — MAGNESIUM: MAGNESIUM: 1.8 mg/dL (ref 1.5–2.5)

## 2013-11-05 LAB — HSV(HERPES SIMPLEX VRS) I + II AB-IGG
HSV 1 Glycoprotein G Ab, IgG: 1.62 IV — ABNORMAL HIGH
HSV 2 Glycoprotein G Ab, IgG: <0.1 IV

## 2013-11-05 LAB — HIV ANTIBODY (ROUTINE TESTING W REFLEX): HIV 1&2 Ab, 4th Generation: NONREACTIVE

## 2013-11-05 LAB — INSULIN, FASTING: Insulin fasting, serum: 103 u[IU]/mL — ABNORMAL HIGH (ref 3–28)

## 2013-11-19 ENCOUNTER — Other Ambulatory Visit (HOSPITAL_COMMUNITY)
Admission: RE | Admit: 2013-11-19 | Discharge: 2013-11-19 | Disposition: A | Discharge status: Home / Self Care | Payer: BC Managed Care – PPO | Source: Ambulatory Visit | Attending: Physician Assistant | Admitting: Physician Assistant

## 2013-11-19 ENCOUNTER — Ambulatory Visit (INDEPENDENT_AMBULATORY_CARE_PROVIDER_SITE_OTHER): Payer: BC Managed Care – PPO | Admitting: Physician Assistant

## 2013-11-19 VITALS — BP 132/70 | HR 88 | Temp 97.3°F | Resp 16 | Wt 322.0 lb

## 2013-11-19 DIAGNOSIS — Z124 Encounter for screening for malignant neoplasm of cervix: Secondary | ICD-10-CM | POA: Insufficient documentation

## 2013-11-19 DIAGNOSIS — R87810 Cervical high risk human papillomavirus (HPV) DNA test positive: Secondary | ICD-10-CM | POA: Insufficient documentation

## 2013-11-19 DIAGNOSIS — Z1151 Encounter for screening for human papillomavirus (HPV): Secondary | ICD-10-CM | POA: Insufficient documentation

## 2013-11-19 MED ORDER — ETONOGESTREL-ETHINYL ESTRADIOL 0.12-0.015 MG/24HR VA RING: VAGINAL_RING | VAGINAL | Status: DC

## 2013-11-19 NOTE — Progress Notes (Signed)
   Subjective:    Patient ID: Christy Foster, female    DOB: July 20, 1990, 23 y.o.   MRN: [id number]  HPI 23 y.o. presents to discuss birth control options and for a Pap.    Review of Systems  All other systems reviewed and are negative.      Objective:   Physical Exam  Constitutional: She is oriented to person, place, and time. She appears well-developed and well-nourished.  HENT:  Head: Normocephalic and atraumatic.  Right Ear: External ear normal.  Left Ear: External ear normal.  Mouth/Throat: Oropharynx is clear and moist.  Eyes: Conjunctivae and EOM are normal. Pupils are equal, round, and reactive to light.  Neck: Normal range of motion. Neck supple. No thyromegaly present.  Cardiovascular: Normal rate, regular rhythm and normal heart sounds.  Exam reveals no gallop and no friction rub.   No murmur heard. Pulmonary/Chest: Effort normal and breath sounds normal. No respiratory distress. She has no wheezes.  Abdominal: Soft. Bowel sounds are normal. She exhibits no distension and no mass. There is no tenderness. There is no rebound and no guarding.  Genitourinary: Vagina normal and uterus normal. Cervix exhibits no motion tenderness, no discharge and no friability.  Pap sent off  Musculoskeletal: Normal range of motion.  Lymphadenopathy:    She has no cervical adenopathy.  Neurological: She is alert and oriented to person, place, and time. She displays normal reflexes. No cranial nerve deficit. Coordination normal.  Skin: Skin is warm and dry.  Psychiatric: She has a normal mood and affect.       Assessment & Plan:  Pap- sent off.  BCP- add bASA, would like to avoid a pill and will try samples of NUVARING, information given.

## 2013-11-19 NOTE — Patient Instructions (Signed)
Add a low dose aspirin once a day or every other day  Ethinyl Estradiol; Etonogestrel vaginal ring What is this medicine? ETHINYL ESTRADIOL; ETONOGESTREL (ETH in il es tra DYE ole; et oh noe JES trel) vaginal ring is a flexible, vaginal ring used as a contraceptive (birth control method). This medicine combines two types of female hormones, an estrogen and a progestin. This ring is used to prevent ovulation and pregnancy. Each ring is effective for one month. This medicine may be used for other purposes; ask your health care provider or pharmacist if you have questions. COMMON BRAND NAME(S): NuvaRing What should I tell my health care provider before I take this medicine? They need to know if you have or ever had any of these conditions: -abnormal vaginal bleeding -blood vessel disease or blood clots -breast, cervical, endometrial, ovarian, liver, or uterine cancer -diabetes -gallbladder disease -heart disease or recent heart attack -high blood pressure -high cholesterol -kidney disease -liver disease -migraine headaches -stroke -systemic lupus erythematosus (SLE) -tobacco smoker -an unusual or allergic reaction to estrogens, progestins, other medicines, foods, dyes, or preservatives -pregnant or trying to get pregnant -breast-feeding How should I use this medicine? Insert the ring into your vagina as directed. Follow the directions on the prescription label. The ring will remain place for 3 weeks and is then removed for a 1-week break. A new ring is inserted 1 week after the last ring was removed, on the same day of the week. Do not use more often than directed. A patient package insert for the product will be given with each prescription and refill. Read this sheet carefully each time. The sheet may change frequently. Contact your pediatrician regarding the use of this medicine in children. Special care may be needed. This medicine has been used in female children who have started  having menstrual periods. Overdosage: If you think you have taken too much of this medicine contact a poison control center or emergency room at once. NOTE: This medicine is only for you. Do not share this medicine with others. What if I miss a dose? You will need to replace your vaginal ring once a month as directed. If the ring should slip out, or if you leave it in longer or shorter than you should, contact your health care professional for advice. What may interact with this medicine? -acetaminophen -antibiotics or medicines for infections, especially rifampin, rifabutin, rifapentine, and griseofulvin, and possibly penicillins or tetracyclines -aprepitant -ascorbic acid (vitamin C) -atorvastatin -barbiturate medicines, such as phenobarbital -bosentan -carbamazepine -caffeine -clofibrate -cyclosporine -dantrolene -doxercalciferol -felbamate -grapefruit juice -hydrocortisone -medicines for anxiety or sleeping problems, such as diazepam or temazepam -medicines for diabetes, including pioglitazone -modafinil -mycophenolate -nefazodone -oxcarbazepine -phenytoin -prednisolone -ritonavir or other medicines for HIV infection or AIDS -rosuvastatin -selegiline -soy isoflavones supplements -St. John's wort -tamoxifen or raloxifene -theophylline -thyroid hormones -topiramate -warfarin This list may not describe all possible interactions. Give your health care provider a list of all the medicines, herbs, non-prescription drugs, or dietary supplements you use. Also tell them if you smoke, drink alcohol, or use illegal drugs. Some items may interact with your medicine. What should I watch for while using this medicine? Visit your doctor or health care professional for regular checks on your progress. You will need a regular breast and pelvic exam and Pap smear while on this medicine. Use an additional method of contraception during the first cycle that you use this ring. If you have  any reason to think you are pregnant, stop using  this medicine right away and contact your doctor or health care professional. If you are using this medicine for hormone related problems, it may take several cycles of use to see improvement in your condition. Smoking increases the risk of getting a blood clot or having a stroke while you are using hormonal birth control, especially if you are more than 22 years old. You are strongly advised not to smoke. This medicine can make your body retain fluid, making your fingers, hands, or ankles swell. Your blood pressure can go up. Contact your doctor or health care professional if you feel you are retaining fluid. This medicine can make you more sensitive to the sun. Keep out of the sun. If you cannot avoid being in the sun, wear protective clothing and use sunscreen. Do not use sun lamps or tanning beds/booths. If you wear contact lenses and notice visual changes, or if the lenses begin to feel uncomfortable, consult your eye care specialist. In some women, tenderness, swelling, or minor bleeding of the gums may occur. Notify your dentist if this happens. Brushing and flossing your teeth regularly may help limit this. See your dentist regularly and inform your dentist of the medicines you are taking. If you are going to have elective surgery, you may need to stop using this medicine before the surgery. Consult your health care professional for advice. This medicine does not protect you against HIV infection (AIDS) or any other sexually transmitted diseases. What side effects may I notice from receiving this medicine? Side effects that you should report to your doctor or health care professional as soon as possible: -breast tissue changes or discharge -changes in vaginal bleeding during your period or between your periods -chest pain -coughing up blood -dizziness or fainting spells -headaches or migraines -leg, arm or groin pain -severe or sudden  headaches -stomach pain (severe) -sudden shortness of breath -sudden loss of coordination, especially on one side of the body -speech problems -symptoms of vaginal infection like itching, irritation or unusual discharge -tenderness in the upper abdomen -vomiting -weakness or numbness in the arms or legs, especially on one side of the body -yellowing of the eyes or skin Side effects that usually do not require medical attention (report to your doctor or health care professional if they continue or are bothersome): -breakthrough bleeding and spotting that continues beyond the 3 initial cycles of pills -breast tenderness -mood changes, anxiety, depression, frustration, anger, or emotional outbursts -increased sensitivity to sun or ultraviolet light -nausea -skin rash, acne, or brown spots on the skin -weight gain (slight) This list may not describe all possible side effects. Call your doctor for medical advice about side effects. You may report side effects to FDA at 1-800-FDA-1088. Where should I keep my medicine? Keep out of the reach of children. Store at room temperature between 15 and 30 degrees C (59 and 86 degrees F) for up to 4 months. The product will expire after 4 months. Protect from light. Throw away any unused medicine after the expiration date. NOTE: This sheet is a summary. It may not cover all possible information. If you have questions about this medicine, talk to your doctor, pharmacist, or health care provider.  2015, [employer]. (2008-04-29 12:03:58)

## 2013-11-23 LAB — CYTOLOGY - PAP

## 2013-11-27 ENCOUNTER — Encounter: Payer: Self-pay | Admitting: Physician Assistant

## 2013-11-27 DIAGNOSIS — R87610 Atypical squamous cells of undetermined significance on cytologic smear of cervix (ASC-US): Secondary | ICD-10-CM

## 2013-12-29 ENCOUNTER — Encounter: Payer: Self-pay | Admitting: Physician Assistant

## 2013-12-29 ENCOUNTER — Ambulatory Visit (INDEPENDENT_AMBULATORY_CARE_PROVIDER_SITE_OTHER): Payer: BC Managed Care – PPO | Admitting: Physician Assistant

## 2013-12-29 VITALS — BP 132/78 | HR 88 | Temp 97.3°F | Resp 16 | Ht 71.0 in | Wt 310.0 lb

## 2013-12-29 DIAGNOSIS — R0602 Shortness of breath: Secondary | ICD-10-CM

## 2013-12-29 DIAGNOSIS — J209 Acute bronchitis, unspecified: Secondary | ICD-10-CM

## 2013-12-29 LAB — COMPREHENSIVE METABOLIC PANEL
ALK PHOS: 70 U/L (ref 39–117)
ALT: 21 U/L (ref 0–35)
AST: 20 U/L (ref 0–37)
Albumin: 4.1 g/dL (ref 3.5–5.2)
BUN: 9 mg/dL (ref 6–23)
CO2: 23 mEq/L (ref 19–32)
Calcium: 9.5 mg/dL (ref 8.4–10.5)
Chloride: 104 mEq/L (ref 96–112)
Creat: 0.77 mg/dL (ref 0.50–1.10)
GLUCOSE: 86 mg/dL (ref 70–99)
POTASSIUM: 3.8 meq/L (ref 3.5–5.3)
Sodium: 137 mEq/L (ref 135–145)
TOTAL PROTEIN: 7.4 g/dL (ref 6.0–8.3)
Total Bilirubin: 0.6 mg/dL (ref 0.2–1.2)

## 2013-12-29 LAB — CBC WITH DIFFERENTIAL/PLATELET
BASOS PCT: 0 % (ref 0–1)
Basophils Absolute: 0 10*3/uL (ref 0.0–0.1)
Eosinophils Absolute: 0.6 10*3/uL (ref 0.0–0.7)
Eosinophils Relative: 4 % (ref 0–5)
HCT: 39.3 % (ref 36.0–46.0)
HEMOGLOBIN: 13.7 g/dL (ref 12.0–15.0)
LYMPHS PCT: 12 % (ref 12–46)
Lymphs Abs: 1.9 10*3/uL (ref 0.7–4.0)
MCH: 28.9 pg (ref 26.0–34.0)
MCHC: 34.9 g/dL (ref 30.0–36.0)
MCV: 82.9 fL (ref 78.0–100.0)
MONO ABS: 1.4 10*3/uL — AB (ref 0.1–1.0)
MONOS PCT: 9 % (ref 3–12)
NEUTROS ABS: 12.1 10*3/uL — AB (ref 1.7–7.7)
NEUTROS PCT: 75 % (ref 43–77)
Platelets: 337 10*3/uL (ref 150–400)
RBC: 4.74 MIL/uL (ref 3.87–5.11)
RDW: 13.3 % (ref 11.5–15.5)
WBC: 16.1 10*3/uL — AB (ref 4.0–10.5)

## 2013-12-29 MED ORDER — AZITHROMYCIN 250 MG PO TABS: ORAL_TABLET | ORAL | Status: DC

## 2013-12-29 MED ORDER — MONTELUKAST SODIUM 10 MG PO TABS: 10.0000 mg | ORAL_TABLET | Freq: Every day | ORAL | Status: DC

## 2013-12-29 MED ORDER — ALBUTEROL SULFATE HFA 108 (90 BASE) MCG/ACT IN AERS: 1.0000 | INHALATION_SPRAY | Freq: Four times a day (QID) | RESPIRATORY_TRACT | Status: DC | PRN

## 2013-12-29 NOTE — Progress Notes (Signed)
   Subjective:    Patient ID: Christy Foster, female    DOB: Jul 27, 1990, 23 y.o.   MRN: [id number]  Cough This is a new problem. Episode onset: 4-5 days. The problem has been gradually worsening. The cough is productive of sputum. Associated symptoms include headaches, nasal congestion, postnasal drip, rhinorrhea, a sore throat, shortness of breath and wheezing. Pertinent negatives include no chest pain, chills, ear congestion, ear pain, fever, heartburn, hemoptysis, myalgias, rash, sweats or weight loss. Exacerbated by: heat, exercise. The treatment provided no relief.   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  Wt Readings from Last 3 Encounters:  12/29/13 310 lb (140.615 kg)  11/19/13 322 lb (146.058 kg)  11/04/13 327 lb (148.326 kg)    Review of Systems  Constitutional: Negative for fever, chills and weight loss.  HENT: Positive for postnasal drip, rhinorrhea and sore throat. Negative for ear pain.   Eyes: Negative.   Respiratory: Positive for cough, shortness of breath and wheezing. Negative for hemoptysis.   Cardiovascular: Negative for chest pain, palpitations and leg swelling.  Gastrointestinal: Negative.  Negative for heartburn.  Genitourinary: Negative.   Musculoskeletal: Negative for myalgias.  Skin: Negative for rash.  Neurological: Positive for headaches.       Objective:   Physical Exam  Constitutional: She appears well-developed and well-nourished.  HENT:  Head: Normocephalic and atraumatic.  Right Ear: External ear normal.  Nose: Right sinus exhibits maxillary sinus tenderness. Right sinus exhibits no frontal sinus tenderness. Left sinus exhibits maxillary sinus tenderness. Left sinus exhibits no frontal sinus tenderness.  Mouth/Throat: Uvula is midline. Posterior oropharyngeal edema (right ) and posterior oropharyngeal erythema present.  Eyes: Conjunctivae and EOM are normal.  Neck: Normal range of motion. Neck  supple.  Cardiovascular: Normal rate, regular rhythm, normal heart sounds and intact distal pulses.   Pulmonary/Chest: Effort normal. No respiratory distress. She has no decreased breath sounds. She has wheezes (diffuse). She has no rhonchi. She has no rales.  Abdominal: Soft. Bowel sounds are normal.  Lymphadenopathy:    She has cervical adenopathy.  Skin: Skin is warm and dry.      Assessment & Plan:  Bronchitis - zpak, albuterol, doctor's note, since new on estrogen will get Ddimer and CBC, CMET to rule out PE

## 2013-12-29 NOTE — Patient Instructions (Signed)

## 2013-12-30 LAB — D-DIMER, QUANTITATIVE: D-Dimer, Quant: 0.67 ug/mL-FEU — ABNORMAL HIGH (ref 0.00–0.48)

## 2014-01-05 ENCOUNTER — Ambulatory Visit: Payer: BC Managed Care – PPO

## 2014-01-05 DIAGNOSIS — R7989 Other specified abnormal findings of blood chemistry: Secondary | ICD-10-CM

## 2014-01-05 DIAGNOSIS — R6889 Other general symptoms and signs: Secondary | ICD-10-CM

## 2014-01-06 LAB — D-DIMER, QUANTITATIVE (NOT AT ARMC): D DIMER QUANT: 0.48 ug{FEU}/mL (ref 0.00–0.48)

## 2014-01-24 ENCOUNTER — Other Ambulatory Visit: Payer: Self-pay | Admitting: Internal Medicine

## 2014-02-11 ENCOUNTER — Ambulatory Visit: Payer: Self-pay | Admitting: Physician Assistant

## 2014-02-21 ENCOUNTER — Other Ambulatory Visit: Payer: Self-pay | Admitting: Internal Medicine

## 2014-03-15 ENCOUNTER — Encounter: Payer: Self-pay | Admitting: Physician Assistant

## 2014-03-15 ENCOUNTER — Ambulatory Visit: Payer: Self-pay | Admitting: Physician Assistant

## 2014-03-15 ENCOUNTER — Ambulatory Visit (INDEPENDENT_AMBULATORY_CARE_PROVIDER_SITE_OTHER): Payer: BC Managed Care – PPO | Admitting: Physician Assistant

## 2014-03-15 VITALS — BP 110/72 | HR 88 | Temp 97.7°F | Resp 16 | Ht 71.0 in | Wt 297.0 lb

## 2014-03-15 DIAGNOSIS — I10 Essential (primary) hypertension: Secondary | ICD-10-CM

## 2014-03-15 DIAGNOSIS — Z111 Encounter for screening for respiratory tuberculosis: Secondary | ICD-10-CM

## 2014-03-15 DIAGNOSIS — R7309 Other abnormal glucose: Secondary | ICD-10-CM

## 2014-03-15 DIAGNOSIS — R7303 Prediabetes: Secondary | ICD-10-CM

## 2014-03-15 DIAGNOSIS — Z23 Encounter for immunization: Secondary | ICD-10-CM

## 2014-03-15 MED ORDER — PHENTERMINE HCL 37.5 MG PO CAPS: 37.5000 mg | ORAL_CAPSULE | Freq: Every day | ORAL | Status: DC

## 2014-03-15 MED ORDER — METFORMIN HCL ER 500 MG PO TB24: 500.0000 mg | ORAL_TABLET | Freq: Two times a day (BID) | ORAL | Status: DC

## 2014-03-15 NOTE — Progress Notes (Signed)
Assessment and Plan:  Hypertension: Continue medication, monitor blood pressure at home. Continue DASH diet.  Reminder to go to the ER if any CP, SOB, nausea, dizziness, severe HA, changes vision/speech, left arm numbness and tingling, and jaw pain. Cholesterol: Continue diet and exercise. Check cholesterol.  Pre-diabetes-Continue diet and exercise. Check A1C Vitamin D Def- check level and continue medications.  Obesity with co morbidities- long discussion about weight loss, diet, and exercise  Will start the patient on phentermine- plan is to get off after these next 3 months   Continue diet and meds as discussed. Follow up in 3 months and will get labs at that time.    HPI 23 y.o. female  presents for 3 month follow up with hypertension, hyperlipidemia, prediabetes and vitamin D.  Her blood pressure has been controlled at home, today their BP is BP: 110/72 mmHg She does not workout but she plays with her dogs and her god baby. She denies chest pain, shortness of breath, dizziness.   She is not on cholesterol medication and denies myalgias. Her cholesterol is at goal. The cholesterol last visit was:   Lab Results  Component Value Date   CHOL 134 11/04/2013   HDL 57 11/04/2013   LDLCALC 52 11/04/2013   TRIG 123 11/04/2013   CHOLHDL 2.4 11/04/2013    Last Y7WA1C in the office was:  Lab Results  Component Value Date   HGBA1C 5.5 11/04/2013    Patient is on Vitamin D supplement.   Lab Results  Component Value Date   VD25OH 48 11/04/2013     BMI is Body mass index is 41.44 kg/(m^2)., she is working on diet and exercise and has done well. She has been on phentermine for 9 months now.   Wt Readings from Last 3 Encounters:  03/15/14 297 lb (134.718 kg)  12/29/13 310 lb (140.615 kg)  11/19/13 322 lb (146.058 kg)    Current Medications:  Current Outpatient Prescriptions on File Prior to Visit  Medication Sig Dispense Refill  . cholecalciferol (VITAMIN D) 1000 UNITS tablet Take 9,000  Units by mouth daily.      Marland Kitchen. etonogestrel-ethinyl estradiol (NUVARING) 0.12-0.015 MG/24HR vaginal ring Insert vaginally and leave in place for 3 consecutive weeks, then remove for 1 week.  1 each  3  . ibuprofen (ADVIL,MOTRIN) 200 MG tablet Take 400 mg by mouth every 6 (six) hours as needed. For pain      . lisinopril (PRINIVIL,ZESTRIL) 20 MG tablet TAKE 1 TABLET BY MOUTH EVERY DAY  90 tablet  1  . Magnesium 500 MG CAPS Take by mouth.      . metFORMIN (GLUCOPHAGE-XR) 500 MG 24 hr tablet Take 1 tablet (500 mg total) by mouth 2 (two) times daily with a meal.  180 tablet  1  . montelukast (SINGULAIR) 10 MG tablet Take 1 tablet (10 mg total) by mouth daily.  30 tablet  2  . phentermine 37.5 MG capsule Take 1 capsule (37.5 mg total) by mouth daily.  30 capsule  2  . predniSONE (DELTASONE) 10 MG tablet 1 po TID x 3 days, 1 PO BID x 3 days, 1 po QD x 5 days  20 tablet  0  . sertraline (ZOLOFT) 100 MG tablet TAKE 1 AND 1/2 TABLETS BY MOUTH DAILY  45 tablet  10   No current facility-administered medications on file prior to visit.   Medical History:  Past Medical History  Diagnosis Date  . Hypertension   . Allergy   .  Pseudoseizure   . Prediabetes   . Morbid obesity    Allergies: No Known Allergies   Review of Systems: [X]  = complains of  [ ]  = denies  General: Fatigue [ ]  Fever [ ]  Chills [ ]  Weakness [ ]   Insomnia [ ]  Eyes: Redness [ ]  Blurred vision [ ]  Diplopia [ ]   ENT: Congestion [ ]  Sinus Pain [ ]  Post Nasal Drip [ ]  Sore Throat [ ]  Earache [ ]   Cardiac: Chest pain/pressure [ ]  SOB [ ]  Orthopnea [ ]   Palpitations [ ]   Paroxysmal nocturnal dyspnea[ ]  Claudication [ ]  Edema [ ]   Pulmonary: Cough [ ]  Wheezing[ ]   SOB [ ]   Snoring [ ]   GI: Nausea [ ]  Vomiting[ ]  Dysphagia[ ]  Heartburn[ ]  Abdominal pain [ ]  Constipation [ ] ; Diarrhea [ ] ; BRBPR [ ]  Melena[ ]  GU: Hematuria[ ]  Dysuria [ ]  Nocturia[ ]  Urgency [ ]   Hesitancy [ ]  Discharge [ ]  Neuro: Headaches[ ]  Vertigo[ ]  Paresthesias[ ]  Spasm  [ ]  Speech changes [ ]  Incoordination [ ]   Ortho: Arthritis [ ]  Joint pain [ ]  Muscle pain [ ]  Joint swelling [ ]  Back Pain [ ]  Skin:  Rash [ ]   Pruritis [ ]  Change in skin lesion [ ]   Psych: Depression[ ]  Anxiety[ ]  Confusion [ ]  Memory loss [ ]   Heme/Lypmh: Bleeding [ ]  Bruising [ ]  Enlarged lymph nodes [ ]   Endocrine: Visual blurring [ ]  Paresthesia [ ]  Polyuria [ ]  Polydypsea [ ]    Heat/cold intolerance [ ]  Hypoglycemia [ ]   Family history- Review and unchanged Social history- Review and unchanged Physical Exam: BP 110/72  Pulse 88  Temp(Src) 97.7 F (36.5 C)  Resp 16  Ht 5\' 11"  (1.803 m)  Wt 297 lb (134.718 kg)  BMI 41.44 kg/m2 Wt Readings from Last 3 Encounters:  03/15/14 297 lb (134.718 kg)  12/29/13 310 lb (140.615 kg)  11/19/13 322 lb (146.058 kg)   General Appearance: Well nourished, in no apparent distress. Eyes: PERRLA, EOMs, conjunctiva no swelling or erythema Sinuses: No Frontal/maxillary tenderness ENT/Mouth: Ext aud canals clear, TMs without erythema, bulging. No erythema, swelling, or exudate on post pharynx.  Tonsils not swollen or erythematous. Hearing normal.  Neck: Supple, thyroid normal.  Respiratory: Respiratory effort normal, BS equal bilaterally without rales, rhonchi, wheezing or stridor.  Cardio: RRR with no MRGs. Brisk peripheral pulses without edema.  Abdomen: Soft, + BS, obese  Non tender, no guarding, rebound, hernias, masses. Lymphatics: Non tender without lymphadenopathy.  Musculoskeletal: Full ROM, 5/5 strength, normal gait.  Skin: Warm, dry without rashes, lesions, ecchymosis.  Neuro: Cranial nerves intact. Normal muscle tone, no cerebellar symptoms. Sensation intact.  Psych: Awake and oriented X 3, normal affect, Insight and Judgment appropriate.    Quentin Mullingollier, Amanda, PA-C 2:09 PM [facility]

## 2014-03-15 NOTE — Patient Instructions (Addendum)
Can cut your lisinopril in HALF and take 1/2 daily, monitor BP  Claritin or loratadine cheapest but likely the weakest  Zyrtec or certizine at night because it can make you sleepy The strongest is allegra or fexafinadine  Cheapest at [employer], [employer], [employer]  What Causes Tonsil Stones? Your tonsils are filled with nooks and crannies where bacteria and other materials, including dead cells and mucous, can become trapped. When this happens, the debris can become concentrated in white formations that occur in the pockets. Tonsil stones, or tonsilloliths, are formed when this trapped debris hardens, or calcifies. This tends to happen most often in people who have chronic inflammation in their tonsils or repeated bouts of tonsillitis. While many people have small tonsilloliths that develop in their tonsils, it is quite rare to have a large and solidified tonsil stone.  How Are Tonsil Stones Treated? The appropriate treatment for a tonsil stone depends on the size of the tonsillolith and its potential to cause discomfort or harm. Options include: No treatment. Many tonsil stones, especially ones that have no symptoms, require no special treatment. At-home removal. Some people choose to dislodge tonsil stones at home with the use of picks or swabs. Salt water gargles. Gargling with warm, salty water may help ease the discomfort of tonsillitis, which often accompanies tonsil stones. Allergy pills- Taking allergy pill every day can decrease the inflammation and decrease the formation of tonsil stones Continue reading below...  Antibiotics. Various antibiotics can be used to treat tonsil stones. While they may be helpful for some people, they cannot correct the basic problem that is causing tonsilloliths. Also, antibiotics can have side effects. Surgical removal. When tonsil stones are exceedingly large and symptomatic, it may be necessary for a surgeon to remove them. In certain instances, a doctor will be  able to perform this relatively simple procedure using a local numbing agent. Then the patient will not need general anesthesia.  Can Tonsil Stones Be Prevented? Take an antihistamine daily can prevent inflammation and formation.  Since tonsil stones are more common in people who have chronic tonsillitis, the only surefire way to prevent them is with surgical removal of the tonsils. This procedure, known as a tonsillectomy, removes the tissues of the tonsils entirely, thereby eliminating the possibility of tonsillolith formation, however, adults are more likely to have complications from the surgery than children so it is considered a last resort.

## 2014-03-18 LAB — TB SKIN TEST
Induration: 0 mm
TB Skin Test: NEGATIVE

## 2014-04-13 ENCOUNTER — Encounter: Payer: Self-pay | Admitting: Physician Assistant

## 2014-04-13 ENCOUNTER — Ambulatory Visit (INDEPENDENT_AMBULATORY_CARE_PROVIDER_SITE_OTHER): Payer: BC Managed Care – PPO | Admitting: Physician Assistant

## 2014-04-13 VITALS — BP 138/90 | HR 92 | Temp 98.4°F | Ht 71.0 in | Wt 294.0 lb

## 2014-04-13 DIAGNOSIS — J029 Acute pharyngitis, unspecified: Secondary | ICD-10-CM

## 2014-04-13 LAB — POCT RAPID STREP A (OFFICE): Rapid Strep A Screen: NEGATIVE

## 2014-04-13 MED ORDER — AZITHROMYCIN 250 MG PO TABS: ORAL_TABLET | ORAL | Status: AC

## 2014-04-13 NOTE — Patient Instructions (Addendum)
-  Salt water gargles. You can also do 1 TSP liquid Maalox and 1 TSP liquid benadryl- mix/ gargle/ spit -Continue to use the Chloraseptic spray for sore throat. -If you drink orange juice, only drink small amount due to high sugar amount. Make sure you drink plenty of water to stay hydrated.  Start taking the Z-Pak this Friday if not feeling better. If you are not feeling better in 10-14 days, then please call the offcie.   Pharyngitis Pharyngitis is redness, pain, and swelling (inflammation) of your pharynx.  CAUSES  Pharyngitis is usually caused by infection. Most of the time, these infections are from viruses (viral) and are part of a cold. However, sometimes pharyngitis is caused by bacteria (bacterial). Pharyngitis can also be caused by allergies. Viral pharyngitis may be spread from person to person by coughing, sneezing, and personal items or utensils (cups, forks, spoons, toothbrushes). Bacterial pharyngitis may be spread from person to person by more intimate contact, such as kissing.  SIGNS AND SYMPTOMS  Symptoms of pharyngitis include:   Sore throat.   Tiredness (fatigue).   Low-grade fever.   Headache.  Joint pain and muscle aches.  Skin rashes.  Swollen lymph nodes.  Plaque-like film on throat or tonsils (often seen with bacterial pharyngitis). DIAGNOSIS  Your health care provider will ask you questions about your illness and your symptoms. Your medical history, along with a physical exam, is often all that is needed to diagnose pharyngitis. Sometimes, a rapid strep test is done. Other lab tests may also be done, depending on the suspected cause.  TREATMENT  Viral pharyngitis will usually get better in 3-4 days without the use of medicine. Bacterial pharyngitis is treated with medicines that kill germs (antibiotics).  HOME CARE INSTRUCTIONS   Drink enough water and fluids to keep your urine clear or pale yellow.   Only take over-the-counter or prescription  medicines as directed by your health care provider:   If you are prescribed antibiotics, make sure you finish them even if you start to feel better.   Do not take aspirin.   Get lots of rest.   Gargle with 8 oz of salt water ( tsp of salt per 1 qt of water) as often as every 1-2 hours to soothe your throat.   Throat lozenges (if you are not at risk for choking) or sprays may be used to soothe your throat. SEEK MEDICAL CARE IF:   You have large, tender lumps in your neck.  You have a rash.  You cough up green, yellow-brown, or bloody spit. SEEK IMMEDIATE MEDICAL CARE IF:   Your neck becomes stiff.  You drool or are unable to swallow liquids.  You vomit or are unable to keep medicines or liquids down.  You have severe pain that does not go away with the use of recommended medicines.  You have trouble breathing (not caused by a stuffy nose). MAKE SURE YOU:   Understand these instructions.  Will watch your condition.  Will get help right away if you are not doing well or get worse. Document Released: 05/14/2005 Document Revised: 03/04/2013 Document Reviewed: 01/19/2013 [facility]ExitCare Patient Information 2015 [employer] [place]. This information is not intended to replace advice given to you by your health care provider. Make sure you discuss any questions you have with your health care provider.

## 2014-04-13 NOTE — Progress Notes (Signed)
Subjective:    Patient ID: Christy Foster, female    DOB: 10-Dec-1990, 23 y.o.   MRN: [id number]  Sore Throat  This is a new problem. Episode onset: 1 week. The problem has been gradually worsening. Sore throat worse side: left side more then the right. Maximum temperature: No fever at home. Associated symptoms include headaches, swollen glands and trouble swallowing. Pertinent negatives include no abdominal pain, congestion, coughing, diarrhea, drooling, ear discharge, ear pain, hoarse voice, plugged ear sensation, neck pain, shortness of breath, stridor or vomiting. Treatments tried: cough drops, spray for sore throat, gargles salt water.   The treatment provided no relief.   Review of Systems  Constitutional: Negative.  Negative for fever, chills, diaphoresis and fatigue.  HENT: Positive for postnasal drip, rhinorrhea, sore throat and trouble swallowing. Negative for congestion, drooling, ear discharge, ear pain, hoarse voice, sinus pressure and voice change.        Noticed white patches on tonsils.  Eyes: Negative.   Respiratory: Negative.  Negative for cough, chest tightness, shortness of breath and stridor.   Cardiovascular: Negative.   Gastrointestinal: Negative.  Negative for nausea, vomiting, abdominal pain, diarrhea and constipation.  Genitourinary: Negative.   Musculoskeletal: Negative.  Negative for neck pain.  Skin: Negative.  Negative for rash.  Allergic/Immunologic: Positive for environmental allergies.  Neurological: Positive for headaches. Negative for dizziness and light-headedness.       Has headache the other night and took ibuprofen and went away.  Psychiatric/Behavioral: Negative.    Past Medical History  Diagnosis Date  . Hypertension   . Allergy   . Pseudoseizure   . Prediabetes   . Morbid obesity    Current Outpatient Prescriptions on File Prior to Visit  Medication Sig Dispense Refill  . etonogestrel-ethinyl estradiol (NUVARING) 0.12-0.015 MG/24HR vaginal  ring Insert vaginally and leave in place for 3 consecutive weeks, then remove for 1 week. 1 each 3  . ibuprofen (ADVIL,MOTRIN) 200 MG tablet Take 400 mg by mouth every 6 (six) hours as needed. For pain    . lisinopril (PRINIVIL,ZESTRIL) 20 MG tablet TAKE 1 TABLET BY MOUTH EVERY DAY 90 tablet 1  . Magnesium 500 MG CAPS Take by mouth.    . metFORMIN (GLUCOPHAGE-XR) 500 MG 24 hr tablet Take 1 tablet (500 mg total) by mouth 2 (two) times daily with a meal. 180 tablet 1  . montelukast (SINGULAIR) 10 MG tablet Take 1 tablet (10 mg total) by mouth daily. 30 tablet 2  . phentermine 37.5 MG capsule Take 1 capsule (37.5 mg total) by mouth daily. 30 capsule 2  . sertraline (ZOLOFT) 100 MG tablet TAKE 1 AND 1/2 TABLETS BY MOUTH DAILY 45 tablet 10   No current facility-administered medications on file prior to visit.   No Known Allergies    BP 138/90 mmHg  Pulse 92  Temp(Src) 98.4 F (36.9 C) (Temporal)  Resp 99  Ht 5\' 11"  (1.803 m)  Wt 294 lb (133.358 kg)  BMI 41.02 kg/m2 Wt Readings from Last 3 Encounters:  04/13/14 294 lb (133.358 kg)  03/15/14 297 lb (134.718 kg)  12/29/13 310 lb (140.615 kg)   Objective:   Physical Exam  Constitutional: She is oriented to person, place, and time. She appears well-developed and well-nourished. She has a sickly appearance. No distress.  HENT:  Head: Normocephalic.  Right Ear: Tympanic membrane, external ear and ear canal normal.  Left Ear: Tympanic membrane, external ear and ear canal normal.  Nose: Nose normal. Right sinus exhibits no  maxillary sinus tenderness and no frontal sinus tenderness. Left sinus exhibits no maxillary sinus tenderness and no frontal sinus tenderness.  Mouth/Throat: Uvula is midline and mucous membranes are normal. Mucous membranes are not pale and not dry. No trismus in the jaw. No uvula swelling. Posterior oropharyngeal edema and posterior oropharyngeal erythema present. No oropharyngeal exudate or tonsillar abscesses.  Tonsil on  the right slightly bigger than tonsil on the left and without exudate bilaterally.    Eyes: Conjunctivae and lids are normal. Pupils are equal, round, and reactive to light. Right eye exhibits no discharge. Left eye exhibits no discharge. No scleral icterus.  Neck: Trachea normal, normal range of motion and phonation normal. Neck supple. No tracheal tenderness present. No tracheal deviation present.  Cardiovascular: Normal rate, regular rhythm, S1 normal, S2 normal, normal heart sounds and normal pulses.  Exam reveals no gallop, no distant heart sounds and no friction rub.   No murmur heard. Pulmonary/Chest: Effort normal and breath sounds normal. No stridor. No respiratory distress. She has no decreased breath sounds. She has no wheezes. She has no rhonchi. She has no rales. She exhibits no tenderness.  Abdominal: Soft. Bowel sounds are normal. There is no tenderness. There is no rebound and no guarding.  Musculoskeletal: Normal range of motion.  Lymphadenopathy:  Tenderness in tonsils upon palpation and LAD in tonsils only.  Neurological: She is alert and oriented to person, place, and time. She has normal strength.  Skin: Skin is warm, dry and intact. No rash noted. She is not diaphoretic.  Psychiatric: She has a normal mood and affect. Her speech is normal and behavior is normal. Judgment and thought content normal. Cognition and memory are normal.  Vitals reviewed.  Assessment & Plan:  1. Acute pharyngitis, unspecified pharyngitis type- Viral -Start taking Z-Pak if not better by Friday 04/16/14 - azithromycin (ZITHROMAX Z-PAK) 250 MG tablet; Take 2 tablets PO on day 1, then take 1 tablet PO QDaily for 4 days.  Dispense: 6 tablet; Refill: 0 -Salt water gargles. You can also do 1 TSP liquid Maalox and 1 TSP liquid benadryl- mix/ gargle/ spit -Continue to use the Chloraseptic spray for sore throat. -If you drink orange juice, only drink small amount due to high sugar content. -Make sure you  are drinking plenty of water to stay hydrated.  2. Sore throat - POCT rapid strep A- Negative  Discussed medication effects and SE's.  Pt agreed to treatment plan. If you are not feeling better in 10-14 days, then please call the office.  Jeneal Vogl, Lise AuerJennifer L, PA-C 3:28 PM [facility]

## 2014-04-29 ENCOUNTER — Other Ambulatory Visit: Payer: Self-pay | Admitting: Physician Assistant

## 2014-05-18 ENCOUNTER — Encounter: Payer: Self-pay | Admitting: Physician Assistant

## 2014-06-30 ENCOUNTER — Ambulatory Visit: Payer: Self-pay | Admitting: Physician Assistant

## 2014-08-04 ENCOUNTER — Other Ambulatory Visit: Payer: Self-pay | Admitting: Physician Assistant

## 2014-08-08 ENCOUNTER — Other Ambulatory Visit: Payer: Self-pay | Admitting: Internal Medicine

## 2014-11-09 ENCOUNTER — Encounter: Payer: Self-pay | Admitting: Physician Assistant

## 2015-01-05 ENCOUNTER — Other Ambulatory Visit: Payer: Self-pay | Admitting: Internal Medicine

## 2015-01-05 ENCOUNTER — Other Ambulatory Visit: Payer: Self-pay | Admitting: Physician Assistant

## 2015-02-24 ENCOUNTER — Other Ambulatory Visit: Payer: Self-pay | Admitting: Physician Assistant

## 2015-03-05 ENCOUNTER — Encounter: Payer: Self-pay | Admitting: Physician Assistant

## 2015-03-11 ENCOUNTER — Encounter: Payer: Self-pay | Admitting: Physician Assistant

## 2015-03-21 ENCOUNTER — Encounter: Payer: Self-pay | Admitting: Physician Assistant

## 2015-04-01 ENCOUNTER — Other Ambulatory Visit: Payer: Self-pay | Admitting: Physician Assistant

## 2015-06-29 ENCOUNTER — Other Ambulatory Visit: Payer: Self-pay | Admitting: Physician Assistant

## 2015-07-11 ENCOUNTER — Encounter: Payer: Self-pay | Admitting: Internal Medicine

## 2015-07-11 ENCOUNTER — Ambulatory Visit (INDEPENDENT_AMBULATORY_CARE_PROVIDER_SITE_OTHER): Payer: BLUE CROSS/BLUE SHIELD | Admitting: Internal Medicine

## 2015-07-11 VITALS — BP 142/80 | HR 84 | Temp 98.4°F | Resp 18 | Ht 71.0 in | Wt 317.0 lb

## 2015-07-11 DIAGNOSIS — I10 Essential (primary) hypertension: Secondary | ICD-10-CM

## 2015-07-11 DIAGNOSIS — E785 Hyperlipidemia, unspecified: Secondary | ICD-10-CM | POA: Diagnosis not present

## 2015-07-11 DIAGNOSIS — Z79899 Other long term (current) drug therapy: Secondary | ICD-10-CM

## 2015-07-11 DIAGNOSIS — F411 Generalized anxiety disorder: Secondary | ICD-10-CM | POA: Diagnosis not present

## 2015-07-11 DIAGNOSIS — R7303 Prediabetes: Secondary | ICD-10-CM | POA: Diagnosis not present

## 2015-07-11 LAB — CBC WITH DIFFERENTIAL/PLATELET
BASOS PCT: 1 % (ref 0–1)
Basophils Absolute: 0.1 10*3/uL (ref 0.0–0.1)
Eosinophils Absolute: 0.4 10*3/uL (ref 0.0–0.7)
Eosinophils Relative: 4 % (ref 0–5)
HCT: 41.3 % (ref 36.0–46.0)
HEMOGLOBIN: 13.9 g/dL (ref 12.0–15.0)
LYMPHS ABS: 4.2 10*3/uL — AB (ref 0.7–4.0)
Lymphocytes Relative: 40 % (ref 12–46)
MCH: 28.8 pg (ref 26.0–34.0)
MCHC: 33.7 g/dL (ref 30.0–36.0)
MCV: 85.5 fL (ref 78.0–100.0)
MONOS PCT: 6 % (ref 3–12)
MPV: 10.9 fL (ref 8.6–12.4)
Monocytes Absolute: 0.6 10*3/uL (ref 0.1–1.0)
NEUTROS ABS: 5.2 10*3/uL (ref 1.7–7.7)
NEUTROS PCT: 49 % (ref 43–77)
Platelets: 306 10*3/uL (ref 150–400)
RBC: 4.83 MIL/uL (ref 3.87–5.11)
RDW: 12.7 % (ref 11.5–15.5)
WBC: 10.6 10*3/uL — ABNORMAL HIGH (ref 4.0–10.5)

## 2015-07-11 LAB — BASIC METABOLIC PANEL WITH GFR
BUN: 10 mg/dL (ref 7–25)
CHLORIDE: 105 mmol/L (ref 98–110)
CO2: 20 mmol/L (ref 20–31)
CREATININE: 0.7 mg/dL (ref 0.50–1.10)
Calcium: 9.1 mg/dL (ref 8.6–10.2)
GFR, Est African American: >89 mL/min (ref >=60)
GFR, Est Non African American: >89 mL/min (ref >=60)
Glucose, Bld: 99 mg/dL (ref 65–99)
Potassium: 3.7 mmol/L (ref 3.5–5.3)
SODIUM: 136 mmol/L (ref 135–146)

## 2015-07-11 LAB — LIPID PANEL
CHOL/HDL RATIO: 2.2 ratio (ref <=5.0)
Cholesterol: 150 mg/dL (ref 125–200)
HDL: 69 mg/dL (ref >=46)
LDL CALC: 61 mg/dL (ref <130)
Triglycerides: 100 mg/dL (ref <150)
VLDL: 20 mg/dL (ref <30)

## 2015-07-11 LAB — HEMOGLOBIN A1C
HEMOGLOBIN A1C: 5.3 % (ref <5.7)
Mean Plasma Glucose: 105 mg/dL (ref <117)

## 2015-07-11 LAB — HEPATIC FUNCTION PANEL
ALK PHOS: 71 U/L (ref 33–115)
ALT: 14 U/L (ref 6–29)
AST: 15 U/L (ref 10–30)
Albumin: 3.6 g/dL (ref 3.6–5.1)
BILIRUBIN DIRECT: 0.1 mg/dL (ref <=0.2)
BILIRUBIN INDIRECT: 0.3 mg/dL (ref 0.2–1.2)
Total Bilirubin: 0.4 mg/dL (ref 0.2–1.2)
Total Protein: 7 g/dL (ref 6.1–8.1)

## 2015-07-11 LAB — TSH: TSH: 2.04 m[IU]/L

## 2015-07-11 MED ORDER — ETONOGESTREL-ETHINYL ESTRADIOL 0.12-0.015 MG/24HR VA RING: VAGINAL_RING | VAGINAL | Status: DC

## 2015-07-11 MED ORDER — SERTRALINE HCL 100 MG PO TABS: 150.0000 mg | ORAL_TABLET | Freq: Every day | ORAL | Status: DC

## 2015-07-11 NOTE — Progress Notes (Signed)
Patient ID: Christy Foster, female   DOB: 03/12/1991, 25 y.o.   MRN: [id number]  Assessment and Plan:  Hypertension: -1/2 tablet of lisinopril -get on schedule with alarm daily  -Continue medication,  -monitor blood pressure at home.  -Continue DASH diet.   -Reminder to go to the ER if any CP, SOB, nausea, dizziness, severe HA, changes vision/speech, left arm numbness and tingling, and jaw pain.  Cholesterol: -Continue diet and exercise.  -Check cholesterol.   Pre-diabetes: -stop metformin unless there is large elevation in A1C -Continue diet and exercise.  -Check A1C  Vitamin D Def: -check level -continue medications.   Generalized anxiety -zoloft refilled  Continue diet and meds as discussed. Further disposition pending results of labs.  HPI 25 y.o. female  presents for 3 month follow up with hypertension, hyperlipidemia, prediabetes and vitamin D.   Her blood pressure has been controlled at home, today their BP is BP: (!) 142/80 mmHg.   She does not workout. She denies chest pain, shortness of breath, dizziness.   She is on cholesterol medication and denies myalgias. Her cholesterol is at goal. The cholesterol last visit was:   Lab Results  Component Value Date   CHOL 134 11/04/2013   HDL 57 11/04/2013   LDLCALC 52 11/04/2013   TRIG 123 11/04/2013   CHOLHDL 2.4 11/04/2013     She has been working on diet and exercise for prediabetes, and denies foot ulcerations, hyperglycemia, hypoglycemia , increased appetite, nausea, paresthesia of the feet, polydipsia, polyuria, visual disturbances, vomiting and weight loss. Last A1C in the office was:  Lab Results  Component Value Date   HGBA1C 5.5 11/04/2013  She reports that she hasn't been taking metformin.    Patient is on Vitamin D supplement.  Lab Results  Component Value Date   VD25OH 48 11/04/2013     Patient reports that she has run out of her anxiety medication and has been out for about a month.  She would like  to go back on it.    Current Medications:  Current Outpatient Prescriptions on File Prior to Visit  Medication Sig Dispense Refill  . lisinopril (PRINIVIL,ZESTRIL) 20 MG tablet TAKE 1 TABLET BY MOUTH EVERY DAY (Patient not taking: Reported on 07/11/2015) 90 tablet 1  . metFORMIN (GLUCOPHAGE-XR) 500 MG 24 hr tablet TAKE 1 TABLET (500 MG TOTAL) BY MOUTH 2 (TWO) TIMES DAILY WITH A MEAL. (Patient not taking: Reported on 07/11/2015) 180 tablet 1  . sertraline (ZOLOFT) 100 MG tablet TAKE 1 AND 1/2 TABLETS BY MOUTH DAILY (Patient not taking: Reported on 07/11/2015) 45 tablet 0   No current facility-administered medications on file prior to visit.    Medical History:  Past Medical History  Diagnosis Date  . Hypertension   . Allergy   . Pseudoseizure (HCC)   . Prediabetes   . Morbid obesity (HCC)     Allergies: No Known Allergies   Review of Systems:  Review of Systems  Constitutional: Negative for fever, chills and malaise/fatigue.  HENT: Negative for congestion, ear pain and sore throat.   Respiratory: Negative for cough, shortness of breath and wheezing.   Cardiovascular: Negative for chest pain, palpitations and leg swelling.  Gastrointestinal: Negative for heartburn, diarrhea, constipation, blood in stool and melena.  Genitourinary: Negative.   Skin: Negative.   Neurological: Negative for dizziness, loss of consciousness and headaches.  Psychiatric/Behavioral: Negative for depression and suicidal ideas. The patient is nervous/anxious. The patient does not have insomnia.  Family history- Review and unchanged  Social history- Review and unchanged  Physical Exam: BP 142/80 mmHg  Pulse 84  Temp(Src) 98.4 F (36.9 C) (Temporal)  Resp 18  Ht  (1.803 m)  Wt 317 lb (143.79 kg)  BMI 44.23 kg/m2 Wt Readings from Last 3 Encounters:  07/11/15 317 lb (143.79 kg)  04/13/14 294 lb (133.358 kg)  03/15/14 297 lb (134.718 kg)    General Appearance: Well nourished well  developed, in no apparent distress. Eyes: PERRLA, EOMs, conjunctiva no swelling or erythema ENT/Mouth: Ear canals normal without obstruction, swelling, erythma, discharge.  TMs normal bilaterally.  Oropharynx moist, clear, without exudate, or postoropharyngeal swelling. Neck: Supple, thyroid normal,no cervical adenopathy  Respiratory: Respiratory effort normal, Breath sounds clear A&P without rhonchi, wheeze, or rale.  No retractions, no accessory usage. Cardio: RRR with no MRGs. Brisk peripheral pulses without edema.  Abdomen: Soft, + BS,  Non tender, no guarding, rebound, hernias, masses. Musculoskeletal: Full ROM, 5/5 strength, Normal gait Skin: Warm, dry without rashes, lesions, ecchymosis.  Neuro: Awake and oriented X 3, Cranial nerves intact. Normal muscle tone, no cerebellar symptoms. Psych: Normal affect, Insight and Judgment appropriate.    Terri Piedra, PA-C 10:25 AM [facility]

## 2015-07-14 ENCOUNTER — Ambulatory Visit: Payer: Self-pay | Admitting: Physician Assistant

## 2015-08-30 ENCOUNTER — Encounter: Payer: Self-pay | Admitting: Physician Assistant

## 2015-11-16 ENCOUNTER — Encounter: Payer: Self-pay | Admitting: Physician Assistant

## 2015-12-21 ENCOUNTER — Encounter: Payer: Self-pay | Admitting: Physician Assistant

## 2015-12-21 ENCOUNTER — Ambulatory Visit (INDEPENDENT_AMBULATORY_CARE_PROVIDER_SITE_OTHER): Payer: BLUE CROSS/BLUE SHIELD | Admitting: Physician Assistant

## 2015-12-21 ENCOUNTER — Other Ambulatory Visit (HOSPITAL_COMMUNITY)
Admission: RE | Admit: 2015-12-21 | Discharge: 2015-12-21 | Disposition: A | Discharge status: Home / Self Care | Payer: BLUE CROSS/BLUE SHIELD | Source: Ambulatory Visit | Attending: Physician Assistant | Admitting: Physician Assistant

## 2015-12-21 VITALS — BP 130/90 | HR 69 | Temp 97.5°F | Resp 16 | Ht 71.0 in | Wt 301.6 lb

## 2015-12-21 DIAGNOSIS — R6889 Other general symptoms and signs: Secondary | ICD-10-CM | POA: Diagnosis not present

## 2015-12-21 DIAGNOSIS — F411 Generalized anxiety disorder: Secondary | ICD-10-CM | POA: Diagnosis not present

## 2015-12-21 DIAGNOSIS — Z113 Encounter for screening for infections with a predominantly sexual mode of transmission: Secondary | ICD-10-CM

## 2015-12-21 DIAGNOSIS — E785 Hyperlipidemia, unspecified: Secondary | ICD-10-CM | POA: Diagnosis not present

## 2015-12-21 DIAGNOSIS — E559 Vitamin D deficiency, unspecified: Secondary | ICD-10-CM | POA: Insufficient documentation

## 2015-12-21 DIAGNOSIS — Z79899 Other long term (current) drug therapy: Secondary | ICD-10-CM | POA: Diagnosis not present

## 2015-12-21 DIAGNOSIS — R7303 Prediabetes: Secondary | ICD-10-CM | POA: Diagnosis not present

## 2015-12-21 DIAGNOSIS — I10 Essential (primary) hypertension: Secondary | ICD-10-CM | POA: Diagnosis not present

## 2015-12-21 DIAGNOSIS — D649 Anemia, unspecified: Secondary | ICD-10-CM | POA: Diagnosis not present

## 2015-12-21 DIAGNOSIS — Z0001 Encounter for general adult medical examination with abnormal findings: Secondary | ICD-10-CM | POA: Diagnosis not present

## 2015-12-21 DIAGNOSIS — Z124 Encounter for screening for malignant neoplasm of cervix: Secondary | ICD-10-CM

## 2015-12-21 DIAGNOSIS — N898 Other specified noninflammatory disorders of vagina: Secondary | ICD-10-CM

## 2015-12-21 DIAGNOSIS — Z01411 Encounter for gynecological examination (general) (routine) with abnormal findings: Secondary | ICD-10-CM | POA: Diagnosis present

## 2015-12-21 DIAGNOSIS — Z1389 Encounter for screening for other disorder: Secondary | ICD-10-CM

## 2015-12-21 DIAGNOSIS — T7840XD Allergy, unspecified, subsequent encounter: Secondary | ICD-10-CM

## 2015-12-21 LAB — CBC WITH DIFFERENTIAL/PLATELET
BASOS ABS: 0 {cells}/uL (ref 0–200)
Basophils Relative: 0 %
EOS PCT: 4 %
Eosinophils Absolute: 536 cells/uL — ABNORMAL HIGH (ref 15–500)
HCT: 40.6 % (ref 35.0–45.0)
HEMOGLOBIN: 13.6 g/dL (ref 11.7–15.5)
LYMPHS ABS: 5092 {cells}/uL — AB (ref 850–3900)
Lymphocytes Relative: 38 %
MCH: 28.7 pg (ref 27.0–33.0)
MCHC: 33.5 g/dL (ref 32.0–36.0)
MCV: 85.7 fL (ref 80.0–100.0)
MONO ABS: 938 {cells}/uL (ref 200–950)
MPV: 10.9 fL (ref 7.5–12.5)
Monocytes Relative: 7 %
NEUTROS ABS: 6834 {cells}/uL (ref 1500–7800)
Neutrophils Relative %: 51 %
Platelets: 311 10*3/uL (ref 140–400)
RBC: 4.74 MIL/uL (ref 3.80–5.10)
RDW: 12.8 % (ref 11.0–15.0)
WBC: 13.4 10*3/uL — AB (ref 3.8–10.8)

## 2015-12-21 LAB — HEMOGLOBIN A1C
HEMOGLOBIN A1C: 5.3 % (ref <5.7)
Mean Plasma Glucose: 105 mg/dL

## 2015-12-21 MED ORDER — METRONIDAZOLE 500 MG PO TABS: 500.0000 mg | ORAL_TABLET | Freq: Two times a day (BID) | ORAL | 0 refills | Status: AC

## 2015-12-21 MED ORDER — ETONOGESTREL-ETHINYL ESTRADIOL 0.12-0.015 MG/24HR VA RING: VAGINAL_RING | VAGINAL | 4 refills | Status: DC

## 2015-12-21 NOTE — Progress Notes (Signed)
Complete Physical  Assessment and Plan:  Essential hypertension - continue medications, DASH diet, exercise and monitor at home. Call if greater than 130/80.  -     CBC with Differential/Platelet -     BASIC METABOLIC PANEL WITH GFR -     Hepatic function panel -     TSH -     Urinalysis, Routine w reflex microscopic (not at [facility]) -     Microalbumin / creatinine urine ratio  Prediabetes -     Hemoglobin A1c -     Insulin, fasting  Morbid obesity, unspecified obesity type (HCC) Obesity with co morbidities- long discussion about weight loss, diet, and exercise  Allergy, subsequent encounter  Hyperlipidemia -     Lipid panel  Medication management -     Magnesium  Generalized anxiety disorder continue medications, stress management techniques discussed, increase water, good sleep hygiene discussed, increase exercise, and increase veggies.   Vitamin D deficiency -     VITAMIN D 25 Hydroxy (Vit-D Deficiency, Fractures)  Screening for cervical cancer -     Cytology - PAP  Encounter for general adult medical examination with abnormal findings -     CBC with Differential/Platelet -     BASIC METABOLIC PANEL WITH GFR -     Hepatic function panel -     TSH -     Lipid panel -     Hemoglobin A1c -     Insulin, fasting -     Magnesium -     VITAMIN D 25 Hydroxy (Vit-D Deficiency, Fractures) -     Urinalysis, Routine w reflex microscopic (not at [facility]) -     Microalbumin / creatinine urine ratio -     Iron and TIBC -     Ferritin -     Vitamin B12 -     Cytology - PAP -     GC/Chlamydia Probe Amp -     WET PREP BY MOLECULAR PROBE -     RPR -     HIV antibody -     HSV(herpes simplex vrs) 1+2 ab-IgG  Anemia, unspecified anemia type -     Iron and TIBC -     Ferritin -     Vitamin B12  Screen for STD (sexually transmitted disease) -     GC/Chlamydia Probe Amp -     WET PREP BY MOLECULAR PROBE -     RPR -     HIV antibody -     HSV(herpes simplex vrs) 1+2  ab-IgG  Vaginal discharge Likely BV, sent in treatment, instructed not to drink with it -     GC/Chlamydia Probe Amp -     WET PREP BY MOLECULAR PROBE  Other orders -     etonogestrel-ethinyl estradiol (NUVARING) 0.12-0.015 MG/24HR vaginal ring; Insert vaginally and leave in place for 3 consecutive weeks, then remove for 1 week.    Discussed med's effects and SE's. Screening labs and tests as requested with regular follow-up as recommended.  HPI 25 y.o. female  presents for a complete physical. Her blood pressure has been controlled at home, today their BP is BP: 130/90 She does not workout, working at [employer], 3rd shift 9pm to 7 am. She denies chest pain, shortness of breath, dizziness.  She is not on cholesterol medication and denies myalgias. Her cholesterol is at goal. The cholesterol last visit was:  Lab Results  Component Value Date   CHOL 150 07/11/2015   HDL  69 07/11/2015   LDLCALC 61 07/11/2015   TRIG 100 07/11/2015   CHOLHDL 2.2 07/11/2015   Last Y8M in the office was within normal range, she has had an elevated A1C of 5.9 in the past, denies DM poly's Lab Results  Component Value Date   HGBA1C 5.3 07/11/2015   Patient is on Vitamin D supplement. Lab Results  Component Value Date   VD25OH 48 11/04/2013    Mag 1.6 Sexually active x 2 years same guy, will get PAP, and states she has had odor with no discharge. will get wet prep and GC/chylamdia.  BMI is Body mass index is 42.06 kg/m., she is working on diet and exercise. Wt Readings from Last 3 Encounters:  12/21/15 (!) 301 lb 9.6 oz (136.8 kg)  07/11/15 (!) 317 lb (143.8 kg)  04/13/14 294 lb (133.4 kg)   .   Current Medications:  Current Outpatient Prescriptions on File Prior to Visit  Medication Sig Dispense Refill  . etonogestrel-ethinyl estradiol (NUVARING) 0.12-0.015 MG/24HR vaginal ring Insert vaginally and leave in place for 3 consecutive weeks, then remove for 1 week. 5 each 0  . lisinopril  (PRINIVIL,ZESTRIL) 20 MG tablet TAKE 1 TABLET BY MOUTH EVERY DAY 90 tablet 1  . sertraline (ZOLOFT) 100 MG tablet Take 1.5 tablets (150 mg total) by mouth daily. 135 tablet 0   No current facility-administered medications on file prior to visit.    Health Maintenance:   Immunization History  Administered Date(s) Administered  . Influenza Split 03/15/2014  . PPD Test 03/15/2014  . Tdap 03/25/2007   Tetanus: 2008 Pneumovax: N/A Flu vaccine: N/A Zostavax:N/A Gaurdisil vaccine: 3/3  No LMP recorded. Patient is not currently having periods (Reason: Oral contraceptives). Pap: DUE today She is sexually active,  Has had 2 partners, same partner 2 years MGM: N/A DEXA: N/A Colonoscopy: N/A EGD: N/A  Medical History:  Past Medical History:  Diagnosis Date  . Allergy   . Hypertension   . Morbid obesity (HCC)   . Prediabetes   . Pseudoseizure (HCC)    Allergies No Known Allergies  SURGICAL HISTORY She  has no past surgical history on file. FAMILY HISTORY Her family history includes Depression in her mother; Hypertension in her father and mother. SOCIAL HISTORY She  reports that she has been smoking.  She has never used smokeless tobacco. She reports that she does not drink alcohol or use drugs.  Review of Systems  Constitutional: Negative for chills, fever and malaise/fatigue.  HENT: Negative for congestion, ear pain and sore throat.   Respiratory: Negative for cough, shortness of breath and wheezing.   Cardiovascular: Negative for chest pain, palpitations and leg swelling.  Gastrointestinal: Negative for blood in stool, constipation, diarrhea, heartburn and melena.  Genitourinary: Negative.   Skin: Negative.   Neurological: Negative for dizziness, loss of consciousness and headaches.  Psychiatric/Behavioral: Negative for depression and suicidal ideas. The patient is nervous/anxious. The patient does not have insomnia.     Physical Exam: Estimated body mass index is 42.06  kg/m as calculated from the following:   Height as of this encounter: 5\' 11"  (1.803 m).   Weight as of this encounter: 301 lb 9.6 oz (136.8 kg). BP 130/90   Pulse 69   Temp 97.5 F (36.4 C) (Temporal)   Resp 16   Ht 5\' 11"  (1.803 m)   Wt (!) 301 lb 9.6 oz (136.8 kg)   SpO2 97%   BMI 42.06 kg/m  Wt Readings from Last 3 Encounters:  12/21/15 (!) 301 lb 9.6 oz (136.8 kg)  07/11/15 (!) 317 lb (143.8 kg)  04/13/14 294 lb (133.4 kg)    General Appearance: Well nourished, in no apparent distress. Eyes: PERRLA, EOMs, conjunctiva no swelling or erythema, normal fundi and vessels. Sinuses: No Frontal/maxillary tenderness ENT/Mouth: Ext aud canals clear, normal light reflex with TMs without erythema, bulging.  Good dentition. No erythema, swelling, or exudate on post pharynx. Tonsils not swollen or erythematous. Hearing normal.  Neck: Supple, thyroid normal. No bruits Respiratory: Respiratory effort normal, BS equal bilaterally without rales, rhonchi, wheezing or stridor. Cardio: RRR without murmurs, rubs or gallops. Brisk peripheral pulses without edema.  Chest: symmetric, with normal excursions and percussion. Breasts: Symmetric, without lumps, nipple discharge, retractions. Abdomen: Soft, +BS, obese. Non tender, no guarding, rebound, hernias, masses, or organomegaly.  Lymphatics: Non tender without lymphadenopathy.  Genitourinary: normal external genitalia, vulva, vagina, cervix, uterus and adnexa, VAGINA: vaginal discharge - clear and malodorous, exam limited by body habitus. Musculoskeletal: Full ROM all peripheral extremities,5/5 strength, and normal gait. Skin: Warm, dry without rashes, lesions, ecchymosis.  Neuro: Cranial nerves intact, reflexes equal bilaterally. Normal muscle tone, no cerebellar symptoms. Sensation intact.  Psych: Awake and oriented X 3, normal affect, Insight and Judgment appropriate.    Quentin Mulling 10:35 AM

## 2015-12-22 ENCOUNTER — Other Ambulatory Visit: Payer: Self-pay | Admitting: Physician Assistant

## 2015-12-22 LAB — RPR

## 2015-12-22 LAB — HEPATIC FUNCTION PANEL
ALK PHOS: 73 U/L (ref 33–115)
ALT: 13 U/L (ref 6–29)
AST: 14 U/L (ref 10–30)
Albumin: 3.8 g/dL (ref 3.6–5.1)
BILIRUBIN DIRECT: 0.1 mg/dL (ref <=0.2)
BILIRUBIN INDIRECT: 0.3 mg/dL (ref 0.2–1.2)
BILIRUBIN TOTAL: 0.4 mg/dL (ref 0.2–1.2)
TOTAL PROTEIN: 7.2 g/dL (ref 6.1–8.1)

## 2015-12-22 LAB — URINALYSIS, ROUTINE W REFLEX MICROSCOPIC
Bilirubin Urine: NEGATIVE
GLUCOSE, UA: NEGATIVE
Ketones, ur: NEGATIVE
LEUKOCYTES UA: NEGATIVE
Nitrite: NEGATIVE
PH: 6 (ref 5.0–8.0)
PROTEIN: NEGATIVE
Specific Gravity, Urine: 1.023 (ref 1.001–1.035)

## 2015-12-22 LAB — MICROALBUMIN / CREATININE URINE RATIO
Creatinine, Urine: 181 mg/dL (ref 20–320)
MICROALB/CREAT RATIO: 2 ug/mg{creat} (ref <30)
Microalb, Ur: 0.3 mg/dL

## 2015-12-22 LAB — WET PREP BY MOLECULAR PROBE
CANDIDA SPECIES: POSITIVE — AB
GARDNERELLA VAGINALIS: NEGATIVE
Trichomonas vaginosis: NEGATIVE

## 2015-12-22 LAB — LIPID PANEL
CHOL/HDL RATIO: 2 ratio (ref <=5.0)
Cholesterol: 146 mg/dL (ref 125–200)
HDL: 73 mg/dL (ref >=46)
LDL CALC: 45 mg/dL (ref <130)
Triglycerides: 142 mg/dL (ref <150)
VLDL: 28 mg/dL (ref <30)

## 2015-12-22 LAB — URINALYSIS, MICROSCOPIC ONLY
Bacteria, UA: NONE SEEN [HPF]
CASTS: NONE SEEN [LPF]
Crystals: NONE SEEN [HPF]
RBC / HPF: NONE SEEN RBC/HPF (ref <=2)
YEAST: NONE SEEN [HPF]

## 2015-12-22 LAB — IRON AND TIBC
%SAT: 30 % (ref 11–50)
IRON: 114 ug/dL (ref 40–190)
TIBC: 380 ug/dL (ref 250–450)
UIBC: 266 ug/dL (ref 125–400)

## 2015-12-22 LAB — BASIC METABOLIC PANEL WITH GFR
BUN: 10 mg/dL (ref 7–25)
CALCIUM: 8.9 mg/dL (ref 8.6–10.2)
CHLORIDE: 108 mmol/L (ref 98–110)
CO2: 23 mmol/L (ref 20–31)
CREATININE: 0.83 mg/dL (ref 0.50–1.10)
GFR, Est Non African American: >89 mL/min (ref >=60)
Glucose, Bld: 71 mg/dL (ref 65–99)
Potassium: 4.2 mmol/L (ref 3.5–5.3)
SODIUM: 139 mmol/L (ref 135–146)

## 2015-12-22 LAB — VITAMIN D 25 HYDROXY (VIT D DEFICIENCY, FRACTURES): Vit D, 25-Hydroxy: 29 ng/mL — ABNORMAL LOW (ref 30–100)

## 2015-12-22 LAB — TSH: TSH: 2.25 mIU/L

## 2015-12-22 LAB — FERRITIN: Ferritin: 46 ng/mL (ref 10–154)

## 2015-12-22 LAB — MAGNESIUM: Magnesium: 1.8 mg/dL (ref 1.5–2.5)

## 2015-12-22 LAB — INSULIN, FASTING: Insulin fasting, serum: 7.8 u[IU]/mL (ref 2.0–19.6)

## 2015-12-22 LAB — HSV(HERPES SIMPLEX VRS) I + II AB-IGG
HSV 1 Glycoprotein G Ab, IgG: <0.9 Index (ref <0.90)
HSV 2 Glycoprotein G Ab, IgG: <0.9 Index (ref <0.90)

## 2015-12-22 LAB — VITAMIN B12: Vitamin B-12: 464 pg/mL (ref 200–1100)

## 2015-12-22 LAB — HIV ANTIBODY (ROUTINE TESTING W REFLEX): HIV: NONREACTIVE

## 2015-12-22 MED ORDER — FLUCONAZOLE 150 MG PO TABS: 150.0000 mg | ORAL_TABLET | Freq: Every day | ORAL | 3 refills | Status: DC

## 2015-12-23 LAB — CYTOLOGY - PAP

## 2015-12-24 ENCOUNTER — Encounter: Payer: Self-pay | Admitting: Physician Assistant

## 2015-12-24 LAB — GC/CHLAMYDIA PROBE AMP
CT Probe RNA: NOT DETECTED
GC Probe RNA: NOT DETECTED

## 2015-12-24 MED ORDER — PHENTERMINE HCL 37.5 MG PO TABS: 37.5000 mg | ORAL_TABLET | Freq: Every day | ORAL | 2 refills | Status: DC

## 2016-01-02 ENCOUNTER — Other Ambulatory Visit: Payer: Self-pay | Admitting: *Deleted

## 2016-01-02 ENCOUNTER — Other Ambulatory Visit: Payer: Self-pay | Admitting: Internal Medicine

## 2016-01-02 MED ORDER — PHENTERMINE HCL 37.5 MG PO TABS: 37.5000 mg | ORAL_TABLET | Freq: Every day | ORAL | 0 refills | Status: DC

## 2016-01-16 ENCOUNTER — Ambulatory Visit: Payer: Self-pay | Admitting: Internal Medicine

## 2016-03-13 ENCOUNTER — Encounter: Payer: Self-pay | Admitting: Internal Medicine

## 2016-03-21 ENCOUNTER — Encounter: Payer: Self-pay | Admitting: Physician Assistant

## 2016-08-29 ENCOUNTER — Encounter: Payer: Self-pay | Admitting: Physician Assistant

## 2016-09-12 ENCOUNTER — Encounter: Payer: Self-pay | Admitting: Physician Assistant

## 2016-09-12 MED ORDER — ETONOGESTREL-ETHINYL ESTRADIOL 0.12-0.015 MG/24HR VA RING: VAGINAL_RING | VAGINAL | 1 refills | Status: DC

## 2016-11-13 ENCOUNTER — Other Ambulatory Visit: Payer: Self-pay | Admitting: Internal Medicine

## 2016-12-24 NOTE — Progress Notes (Deleted)
Complete Physical  Assessment and Plan:  Essential hypertension - continue medications, DASH diet, exercise and monitor at home. Call if greater than 130/80.  -     CBC with Differential/Platelet -     BASIC METABOLIC PANEL WITH GFR -     Hepatic function panel -     TSH -     Urinalysis, Routine w reflex microscopic ([facility]) -     Microalbumin / creatinine urine ratio  Prediabetes -     Hemoglobin A1c -     Insulin, fasting  Morbid obesity, unspecified obesity type (HCC) Obesity with co morbidities- long discussion about weight loss, diet, and exercise  Allergy, subsequent encounter  Hyperlipidemia -     Lipid panel  Medication management -     Magnesium  Generalized anxiety disorder continue medications, stress management techniques discussed, increase water, good sleep hygiene discussed, increase exercise, and increase veggies.   Vitamin D deficiency -     VITAMIN D 25 Hydroxy (Vit-D Deficiency, Fractures)  Screening for cervical cancer -     Cytology - PAP  Encounter for general adult medical examination with abnormal findings  Anemia, unspecified anemia type -     Iron and TIBC -     Ferritin -     Vitamin B12  Screen for STD (sexually transmitted disease) -     GC/Chlamydia Probe Amp -     WET PREP BY MOLECULAR PROBE -     RPR -     HIV antibody -     HSV(herpes simplex vrs) 1+2 ab-IgG   Discussed med's effects and SE's. Screening labs and tests as requested with regular follow-up as recommended.  HPI 26 y.o. female  presents for a complete physical. Her blood pressure has been controlled at home, today their BP is   She does not workout, working at [employer]. She denies chest pain, shortness of breath, dizziness.  She is not on cholesterol medication and denies myalgias. Her cholesterol is at goal. The cholesterol last visit was:  Lab Results  Component Value Date   CHOL 146 12/21/2015   HDL 73 12/21/2015   LDLCALC 45 12/21/2015   TRIG  142 12/21/2015   CHOLHDL 2.0 12/21/2015   Last F6OA1C in the office was within normal range, she has had an elevated A1C of 5.9 in the past, denies DM poly's Lab Results  Component Value Date   HGBA1C 5.3 12/21/2015   Patient is on Vitamin D supplement. Lab Results  Component Value Date   VD25OH 29 (L) 12/21/2015   BMI is There is no height or weight on file to calculate BMI., she is working on diet and exercise. Wt Readings from Last 3 Encounters:  12/21/15 (!) 301 lb 9.6 oz (136.8 kg)  07/11/15 (!) 317 lb (143.8 kg)  04/13/14 294 lb (133.4 kg)   .   Current Medications:  Current Outpatient Prescriptions on File Prior to Visit  Medication Sig Dispense Refill  . etonogestrel-ethinyl estradiol (NUVARING) 0.12-0.015 MG/24HR vaginal ring Insert vaginally and leave in place for 3 consecutive weeks, then remove for 1 week. 3 each 1  . fluconazole (DIFLUCAN) 150 MG tablet Take 1 tablet (150 mg total) by mouth daily. 1 tablet 3  . lisinopril (PRINIVIL,ZESTRIL) 20 MG tablet TAKE 1 TABLET BY MOUTH EVERY DAY 90 tablet 1  . phentermine (ADIPEX-P) 37.5 MG tablet Take 1 tablet (37.5 mg total) by mouth daily before breakfast. 30 tablet 0  . sertraline (ZOLOFT) 100  MG tablet TAKE 1 AND 1/2 TABLETS (150 MG TOTAL) BY MOUTH DAILY. 45 tablet 0   No current facility-administered medications on file prior to visit.    Health Maintenance:   Immunization History  Administered Date(s) Administered  . Influenza Split 03/15/2014  . PPD Test 03/15/2014  . Tdap 03/25/2007   Tetanus: 2008 Pneumovax: N/A Flu vaccine: N/A Zostavax:N/A Gaurdisil vaccine: 3/3  No LMP recorded. Patient is not currently having periods (Reason: Oral contraceptives). Pap: DUE today She is sexually active,  Has had 2 partners, same partner 2 years MGM: N/A DEXA: N/A Colonoscopy: N/A EGD: N/A  Medical History:  Past Medical History:  Diagnosis Date  . Allergy   . Hypertension   . Morbid obesity (HCC)   . Prediabetes    . Pseudoseizure    Allergies No Known Allergies  SURGICAL HISTORY She  has no past surgical history on file. FAMILY HISTORY Her family history includes Depression in her mother; Hypertension in her father and mother. SOCIAL HISTORY She  reports that she has been smoking.  She has never used smokeless tobacco. She reports that she does not drink alcohol or use drugs.  Review of Systems  Constitutional: Negative for chills, fever and malaise/fatigue.  HENT: Negative for congestion, ear pain and sore throat.   Respiratory: Negative for cough, shortness of breath and wheezing.   Cardiovascular: Negative for chest pain, palpitations and leg swelling.  Gastrointestinal: Negative for blood in stool, constipation, diarrhea, heartburn and melena.  Genitourinary: Negative.   Skin: Negative.   Neurological: Negative for dizziness, loss of consciousness and headaches.  Psychiatric/Behavioral: Negative for depression and suicidal ideas. The patient is not nervous/anxious and does not have insomnia.     Physical Exam: Estimated body mass index is 42.06 kg/m as calculated from the following:   Height as of 12/21/15: 5\' 11"  (1.803 m).   Weight as of 12/21/15: 301 lb 9.6 oz (136.8 kg). There were no vitals taken for this visit. Wt Readings from Last 3 Encounters:  12/21/15 (!) 301 lb 9.6 oz (136.8 kg)  07/11/15 (!) 317 lb (143.8 kg)  04/13/14 294 lb (133.4 kg)    General Appearance: Well nourished, in no apparent distress. Eyes: PERRLA, EOMs, conjunctiva no swelling or erythema, normal fundi and vessels. Sinuses: No Frontal/maxillary tenderness ENT/Mouth: Ext aud canals clear, normal light reflex with TMs without erythema, bulging.  Good dentition. No erythema, swelling, or exudate on post pharynx. Tonsils not swollen or erythematous. Hearing normal.  Neck: Supple, thyroid normal. No bruits Respiratory: Respiratory effort normal, BS equal bilaterally without rales, rhonchi, wheezing or  stridor. Cardio: RRR without murmurs, rubs or gallops. Brisk peripheral pulses without edema.  Chest: symmetric, with normal excursions and percussion. Breasts: Symmetric, without lumps, nipple discharge, retractions. Abdomen: Soft, +BS, obese. Non tender, no guarding, rebound, hernias, masses, or organomegaly.  Lymphatics: Non tender without lymphadenopathy.  Genitourinary: normal external genitalia, vulva, vagina, cervix, uterus and adnexa, VAGINA: vaginal discharge - clear and malodorous, exam limited by body habitus. Musculoskeletal: Full ROM all peripheral extremities,5/5 strength, and normal gait. Skin: Warm, dry without rashes, lesions, ecchymosis.  Neuro: Cranial nerves intact, reflexes equal bilaterally. Normal muscle tone, no cerebellar symptoms. Sensation intact.  Psych: Awake and oriented X 3, normal affect, Insight and Judgment appropriate.    Christy MullingAmanda Zygmunt Foster 7:14 AM

## 2016-12-25 ENCOUNTER — Encounter: Payer: Self-pay | Admitting: Physician Assistant

## 2017-03-06 ENCOUNTER — Other Ambulatory Visit: Payer: Self-pay | Admitting: Physician Assistant

## 2017-05-26 NOTE — Progress Notes (Signed)
Complete Physical  Assessment and Plan:  Diagnoses and all orders for this visit:  Encounter for general adult medical examination with abnormal findings  Essential hypertension Above goal; has not been taking medication; risks/benefits discussed - restart lisinopril Advised to obtain BP cuff and monitor blood pressure at home; call if consistently over 130/80 Continue DASH diet.   Reminder to go to the ER if any CP, SOB, nausea, dizziness, severe HA, changes vision/speech, left arm numbness and tingling and jaw pain.  Screening for diabetes mellitus -     Hemoglobin A1c  Morbid obesity (HCC) Long discussion about weight loss, diet, and exercise Recommended diet heavy in fruits and veggies and low in animal meats, cheeses, and dairy products, appropriate calorie intake Discussed appropriate weight for height  Patient on phentermine with benefit and no SE, taking drug breaks; continue close follow up.  Follow up 6 months  Screening for cholesterol level -     Lipid panel  Generalized anxiety disorder She reports she stopped taking SSRI and is doing well off of medication; does not wish to restart at this time Stress management techniques discussed, increase water, good sleep hygiene discussed, increase exercise, and increase veggies.   Vitamin D deficiency -     VITAMIN D 25 Hydroxy (Vit-D Deficiency, Fractures)  Tobacco use Discussed risks associated with tobacco use and advised to reduce or quit Patient is not ready to do so, but advised to consider strongly Will follow up at the next visit  Screening for STDs (sexually transmitted diseases)  Advised to stop douching GC/chlamydia  Wet prep  Screening for hematuria or proteinuria -     Urinalysis w microscopic + reflex cultur -     Microalbumin / creatinine urine ratio  Screening for thyroid disorder -     TSH  Screening for deficiency anemia -     CBC with Differential/Platelet -     Iron,Total/Total Iron  Binding Cap -     Vitamin B12  Medication management -     CBC with Differential/Platelet -     BASIC METABOLIC PANEL WITH GFR -     Hepatic function panel -     Urinalysis w microscopic + reflex cultur  Tobacco use Discussed risks associated with tobacco use and advised to reduce or quit Patient is not ready to do so, but advised to consider strongly - declines medications today Will follow up at the next visit  Discussed med's effects and SE's. Screening labs and tests as requested with regular follow-up as recommended. Over 40 minutes of exam, counseling, chart review, and complex, high level critical decision making was performed this visit.   Future Appointments  Date Time Provider Department Center  11/26/2017  9:30 AM Judd Gaudierorbett, Shenia Alan, NP [facility] None  05/27/2018  9:00 AM Judd Gaudierorbett, Harmonee Tozer, NP [facility] None     HPI  26 y.o. Caucasian female  presents for a complete physical and follow up for has Hypertension; Morbid obesity (HCC); Generalized anxiety disorder; Vitamin D deficiency; and Smoker on their problem list. 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 She is in a steady relationship of 2 years; she does request basic STD screening today; denies concerning symptoms. She does ask about normal vaginal odors - advised to avoid douching, discussed weight loss, will check STDs today.   BMI is Body mass index is 45.77 kg/m., she has not been working on diet and exercise. Wt Readings from Last 3 Encounters:  05/27/17 (!) 314 lb (142.4 kg)  12/21/15 (!) 301 lb 9.6 oz (136.8 kg)  07/11/15 (!) 317 lb (143.8 kg)   She does not currently check her BP at home, today their BP is BP: 140/82 She does not workout. She denies chest pain, shortness of breath, dizziness.   She is not on cholesterol medication and denies myalgias. Her cholesterol is at goal. The cholesterol last visit was:   Lab Results  Component Value Date   CHOL 146 12/21/2015   HDL 73 12/21/2015   LDLCALC 45  12/21/2015   TRIG 142 12/21/2015   CHOLHDL 2.0 12/21/2015    Last A1C in the office was:  Lab Results  Component Value Date   HGBA1C 5.3 12/21/2015   Last GFR: Lab Results  Component Value Date   GFRNONAA >89 12/21/2015   Patient is not currently Vitamin D supplement and remained below goal at the last check:    Lab Results  Component Value Date   VD25OH 29 (L) 12/21/2015      Current Medications:  Current Outpatient Medications on File Prior to Visit  Medication Sig Dispense Refill  . etonogestrel-ethinyl estradiol (NUVARING) 0.12-0.015 MG/24HR vaginal ring Insert vaginally and leave in place for 3 consecutive weeks, then remove for 1 week. 3 each 1   No current facility-administered medications on file prior to visit.    Allergies:  No Known Allergies Medical History:  She has Hypertension; Morbid obesity (HCC); Generalized anxiety disorder; Vitamin D deficiency; and Smoker on their problem list. Health Maintenance:   Immunization History  Administered Date(s) Administered  . Influenza Split 03/15/2014  . PPD Test 03/15/2014  . Td 05/27/2017  . Tdap 03/25/2007   Tetanus: 2008 DUE given today Pneumovax: N/A Flu vaccine:  DUE - not available in office - encouraged to get at pharmacy Zostavax:N/A Gaurdisil vaccine: 3/3  LMP: No LMP recorded. Patient is not currently having periods (Reason: Other). On nuvaring with spotting.  Pap: 2017                        She is sexually active,  Has had 2 partners, same partner 2 years MGM: N/A DEXA: N/A Colonoscopy: N/A EGD: N/A  Last Dental Exam: several years - reminded she should go at least annually Last Eye Exam: 2018 - wears contacts  Patient Care Team: Lucky Cowboy, MD as PCP - General (Internal Medicine)  Surgical History:  She has no past surgical history on file. Family History:  Herfamily history includes Depression in her mother; Hypertension in her father and mother; Lung cancer in her paternal  grandmother. Social History:  She reports that she has been smoking cigarettes.  She has a 2.00 pack-year smoking history. she has never used smokeless tobacco. She reports that she does not drink alcohol or use drugs.  Review of Systems: Review of Systems  Constitutional: Negative for malaise/fatigue and weight loss.  HENT: Negative for hearing loss and tinnitus.   Eyes: Negative for blurred vision and double vision.  Respiratory: Negative for cough, shortness of breath and wheezing.   Cardiovascular: Negative for chest pain, palpitations, orthopnea, claudication and leg swelling.  Gastrointestinal: Negative for abdominal pain, blood in stool, constipation, diarrhea, heartburn, melena, nausea and vomiting.  Genitourinary: Negative.  Negative for dysuria, flank pain, frequency, hematuria and urgency.  Musculoskeletal: Negative for joint pain and myalgias.  Skin: Negative for rash.  Neurological: Negative for dizziness, tingling, sensory change, weakness and headaches.  Endo/Heme/Allergies: Negative for environmental allergies and polydipsia.  Psychiatric/Behavioral: Negative  for depression and substance abuse. The patient is nervous/anxious (Intermittent, mild, denies panic attacks). The patient does not have insomnia.   All other systems reviewed and are negative.   Physical Exam: Estimated body mass index is 45.77 kg/m as calculated from the following:   Height as of this encounter: 5' 9.45" (1.764 m).   Weight as of this encounter: 314 lb (142.4 kg). BP 140/82   Pulse 83   Temp 97.7 F (36.5 C)   Ht 5' 9.45" (1.764 m)   Wt (!) 314 lb (142.4 kg)   SpO2 98%   BMI 45.77 kg/m  General Appearance: Well nourished, morbidly obese, in no apparent distress.  Eyes: PERRLA, EOMs, conjunctiva no swelling or erythema, normal fundi and vessels.  Sinuses: No Frontal/maxillary tenderness  ENT/Mouth: Ext aud canals clear, normal light reflex with TMs without erythema, bulging. Good dentition.  No erythema, swelling, or exudate on post pharynx. Tonsils not swollen or erythematous. Hearing normal.  Neck: Supple, thyroid normal. No bruits  Respiratory: Respiratory effort normal, BS equal bilaterally without rales, rhonchi, wheezing or stridor.  Cardio: RRR without murmurs, rubs or gallops. Brisk peripheral pulses without edema.  Chest: symmetric, with normal excursions and percussion.  Breasts: Symmetric, without lumps, nipple discharge, retractions.  Abdomen: Soft, nontender, no guarding, rebound, hernias, masses, or organomegaly.  Lymphatics: Non tender without lymphadenopathy.  Genitourinary:  Musculoskeletal: Full ROM all peripheral extremities,5/5 strength, and normal gait.  Skin: Warm, dry without rashes, lesions, ecchymosis. Neuro: Cranial nerves intact, reflexes equal bilaterally. Normal muscle tone, no cerebellar symptoms. Sensation intact.  Psych: Awake and oriented X 3, normal affect, Insight and Judgment appropriate.   EKG: Defer  Christy MakerAshley C Moraima Foster 10:59 AM [facility]

## 2017-05-27 ENCOUNTER — Ambulatory Visit (INDEPENDENT_AMBULATORY_CARE_PROVIDER_SITE_OTHER): Payer: BLUE CROSS/BLUE SHIELD | Admitting: Adult Health

## 2017-05-27 ENCOUNTER — Encounter: Payer: Self-pay | Admitting: Adult Health

## 2017-05-27 VITALS — BP 140/82 | HR 83 | Temp 97.7°F | Ht 69.45 in | Wt 314.0 lb

## 2017-05-27 DIAGNOSIS — Z0000 Encounter for general adult medical examination without abnormal findings: Secondary | ICD-10-CM

## 2017-05-27 DIAGNOSIS — Z13220 Encounter for screening for lipoid disorders: Secondary | ICD-10-CM

## 2017-05-27 DIAGNOSIS — Z113 Encounter for screening for infections with a predominantly sexual mode of transmission: Secondary | ICD-10-CM | POA: Diagnosis not present

## 2017-05-27 DIAGNOSIS — I10 Essential (primary) hypertension: Secondary | ICD-10-CM

## 2017-05-27 DIAGNOSIS — E559 Vitamin D deficiency, unspecified: Secondary | ICD-10-CM | POA: Diagnosis not present

## 2017-05-27 DIAGNOSIS — Z130 Encounter for screening for diseases of the blood and blood-forming organs and certain disorders involving the immune mechanism: Secondary | ICD-10-CM

## 2017-05-27 DIAGNOSIS — F411 Generalized anxiety disorder: Secondary | ICD-10-CM

## 2017-05-27 DIAGNOSIS — F17200 Nicotine dependence, unspecified, uncomplicated: Secondary | ICD-10-CM | POA: Insufficient documentation

## 2017-05-27 DIAGNOSIS — Z1329 Encounter for screening for other suspected endocrine disorder: Secondary | ICD-10-CM | POA: Diagnosis not present

## 2017-05-27 DIAGNOSIS — Z23 Encounter for immunization: Secondary | ICD-10-CM

## 2017-05-27 DIAGNOSIS — Z79899 Other long term (current) drug therapy: Secondary | ICD-10-CM

## 2017-05-27 DIAGNOSIS — Z0001 Encounter for general adult medical examination with abnormal findings: Secondary | ICD-10-CM

## 2017-05-27 DIAGNOSIS — Z131 Encounter for screening for diabetes mellitus: Secondary | ICD-10-CM

## 2017-05-27 DIAGNOSIS — Z1389 Encounter for screening for other disorder: Secondary | ICD-10-CM

## 2017-05-27 MED ORDER — PHENTERMINE HCL 37.5 MG PO TABS: 37.5000 mg | ORAL_TABLET | Freq: Every day | ORAL | 3 refills | Status: AC

## 2017-05-27 MED ORDER — LISINOPRIL 20 MG PO TABS: ORAL_TABLET | ORAL | 1 refills | Status: DC

## 2017-05-27 NOTE — Patient Instructions (Signed)

## 2017-05-28 LAB — WET PREP BY MOLECULAR PROBE
Candida species: NOT DETECTED
Gardnerella vaginalis: NOT DETECTED
MICRO NUMBER: 90000482
SPECIMEN QUALITY: ADEQUATE
Trichomonas vaginosis: NOT DETECTED

## 2017-05-29 LAB — URINALYSIS W MICROSCOPIC + REFLEX CULTURE
Bilirubin Urine: NEGATIVE
Glucose, UA: NEGATIVE
Hgb urine dipstick: NEGATIVE
Hyaline Cast: NONE SEEN /LPF
Ketones, ur: NEGATIVE
Nitrites, Initial: NEGATIVE
PH: 6.5 (ref 5.0–8.0)
Protein, ur: NEGATIVE
RBC / HPF: NONE SEEN /HPF (ref 0–2)
SPECIFIC GRAVITY, URINE: 1.017 (ref 1.001–1.03)

## 2017-05-29 LAB — CBC WITH DIFFERENTIAL/PLATELET
Basophils Absolute: 86 cells/uL (ref 0–200)
Basophils Relative: 0.7 %
EOS ABS: 332 {cells}/uL (ref 15–500)
Eosinophils Relative: 2.7 %
HCT: 39.2 % (ref 35.0–45.0)
HEMOGLOBIN: 13.3 g/dL (ref 11.7–15.5)
Lymphs Abs: 3985 cells/uL — ABNORMAL HIGH (ref 850–3900)
MCH: 28.5 pg (ref 27.0–33.0)
MCHC: 33.9 g/dL (ref 32.0–36.0)
MCV: 83.9 fL (ref 80.0–100.0)
MONOS PCT: 6.4 %
MPV: 11.4 fL (ref 7.5–12.5)
NEUTROS ABS: 7109 {cells}/uL (ref 1500–7800)
Neutrophils Relative %: 57.8 %
Platelets: 333 10*3/uL (ref 140–400)
RBC: 4.67 10*6/uL (ref 3.80–5.10)
RDW: 12 % (ref 11.0–15.0)
TOTAL LYMPHOCYTE: 32.4 %
WBC mixed population: 787 cells/uL (ref 200–950)
WBC: 12.3 10*3/uL — ABNORMAL HIGH (ref 3.8–10.8)

## 2017-05-29 LAB — BASIC METABOLIC PANEL WITH GFR
BUN: 8 mg/dL (ref 7–25)
CO2: 24 mmol/L (ref 20–32)
CREATININE: 0.74 mg/dL (ref 0.50–1.10)
Calcium: 9 mg/dL (ref 8.6–10.2)
Chloride: 108 mmol/L (ref 98–110)
GFR, Est African American: 130 mL/min/{1.73_m2} (ref >=60)
GFR, Est Non African American: 113 mL/min/{1.73_m2} (ref >=60)
GLUCOSE: 89 mg/dL (ref 65–99)
POTASSIUM: 4.2 mmol/L (ref 3.5–5.3)
Sodium: 141 mmol/L (ref 135–146)

## 2017-05-29 LAB — HEPATIC FUNCTION PANEL
AG RATIO: 1.2 (calc) (ref 1.0–2.5)
ALKALINE PHOSPHATASE (APISO): 75 U/L (ref 33–115)
ALT: 13 U/L (ref 6–29)
AST: 17 U/L (ref 10–30)
Albumin: 3.7 g/dL (ref 3.6–5.1)
BILIRUBIN INDIRECT: 0.3 mg/dL (ref 0.2–1.2)
BILIRUBIN TOTAL: 0.4 mg/dL (ref 0.2–1.2)
Bilirubin, Direct: 0.1 mg/dL (ref 0.0–0.2)
Globulin: 3.1 g/dL (calc) (ref 1.9–3.7)
TOTAL PROTEIN: 6.8 g/dL (ref 6.1–8.1)

## 2017-05-29 LAB — LIPID PANEL
Cholesterol: 141 mg/dL (ref <200)
HDL: 70 mg/dL (ref >50)
LDL Cholesterol (Calc): 55 mg/dL (calc)
NON-HDL CHOLESTEROL (CALC): 71 mg/dL (ref <130)
Total CHOL/HDL Ratio: 2 (calc) (ref <5.0)
Triglycerides: 81 mg/dL (ref <150)

## 2017-05-29 LAB — URINE CULTURE
MICRO NUMBER: 90002558
SPECIMEN QUALITY: ADEQUATE

## 2017-05-29 LAB — IRON, TOTAL/TOTAL IRON BINDING CAP
%SAT: 15 % (ref 11–50)
Iron: 57 ug/dL (ref 40–190)
TIBC: 372 mcg/dL (calc) (ref 250–450)

## 2017-05-29 LAB — CULTURE INDICATED

## 2017-05-29 LAB — C. TRACHOMATIS/N. GONORRHOEAE RNA
C. trachomatis RNA, TMA: NOT DETECTED
N. GONORRHOEAE RNA, TMA: NOT DETECTED

## 2017-05-29 LAB — VITAMIN B12: Vitamin B-12: 391 pg/mL (ref 200–1100)

## 2017-05-29 LAB — HEMOGLOBIN A1C
Hgb A1c MFr Bld: 5 % of total Hgb (ref <5.7)
MEAN PLASMA GLUCOSE: 97 (calc)
eAG (mmol/L): 5.4 (calc)

## 2017-05-29 LAB — TSH: TSH: 2.44 mIU/L

## 2017-05-29 LAB — MICROALBUMIN / CREATININE URINE RATIO: CREATININE, URINE: 114 mg/dL (ref 20–275)

## 2017-05-29 LAB — VITAMIN D 25 HYDROXY (VIT D DEFICIENCY, FRACTURES): VIT D 25 HYDROXY: 22 ng/mL — AB (ref 30–100)

## 2017-06-04 ENCOUNTER — Other Ambulatory Visit: Payer: Self-pay | Admitting: Physician Assistant

## 2017-11-23 ENCOUNTER — Other Ambulatory Visit: Payer: Self-pay | Admitting: Adult Health

## 2017-11-23 DIAGNOSIS — I10 Essential (primary) hypertension: Secondary | ICD-10-CM

## 2017-11-26 ENCOUNTER — Other Ambulatory Visit: Payer: Self-pay | Admitting: Adult Health

## 2017-11-26 ENCOUNTER — Ambulatory Visit: Payer: Self-pay | Admitting: Adult Health

## 2017-12-02 ENCOUNTER — Encounter (INDEPENDENT_AMBULATORY_CARE_PROVIDER_SITE_OTHER): Payer: Self-pay

## 2017-12-30 ENCOUNTER — Encounter: Payer: Self-pay | Admitting: Physician Assistant

## 2018-05-27 ENCOUNTER — Encounter: Payer: Self-pay | Admitting: Adult Health
